# Patient Record
Sex: Male | Born: 1950 | Race: White | Hispanic: No | Marital: Married | Smoking: Former smoker
Health system: Southern US, Community
[De-identification: ages and names within clinical notes are randomized; demographics above are authoritative.]

## PROBLEM LIST (undated history)

## (undated) DIAGNOSIS — I10 Essential (primary) hypertension: Secondary | ICD-10-CM

## (undated) DIAGNOSIS — K219 Gastro-esophageal reflux disease without esophagitis: Secondary | ICD-10-CM

## (undated) DIAGNOSIS — N529 Male erectile dysfunction, unspecified: Secondary | ICD-10-CM

## (undated) DIAGNOSIS — R251 Tremor, unspecified: Secondary | ICD-10-CM

## (undated) HISTORY — PX: ROTATOR CUFF REPAIR: SHX139

## (undated) HISTORY — PX: SECONDARY INTRAOCULAR LENSE IMPLANTATION: SHX2390

## (undated) HISTORY — DX: Male erectile dysfunction, unspecified: N52.9

## (undated) HISTORY — DX: Gastro-esophageal reflux disease without esophagitis: K21.9

## (undated) HISTORY — DX: Tremor, unspecified: R25.1

---

## 2001-12-05 ENCOUNTER — Ambulatory Visit (HOSPITAL_COMMUNITY): Admission: RE | Admit: 2001-12-05 | Discharge: 2001-12-05 | Payer: Self-pay | Admitting: Gastroenterology

## 2006-03-25 ENCOUNTER — Encounter: Admission: RE | Admit: 2006-03-25 | Discharge: 2006-03-25 | Payer: Self-pay | Admitting: Specialist

## 2007-02-06 ENCOUNTER — Ambulatory Visit (HOSPITAL_BASED_OUTPATIENT_CLINIC_OR_DEPARTMENT_OTHER): Admission: RE | Admit: 2007-02-06 | Discharge: 2007-02-06 | Payer: Self-pay | Admitting: Family Medicine

## 2007-02-09 ENCOUNTER — Ambulatory Visit: Payer: Self-pay | Admitting: Internal Medicine

## 2008-11-26 HISTORY — PX: ANKLE FRACTURE SURGERY: SHX122

## 2011-03-05 DIAGNOSIS — Z52001 Unspecified donor, stem cells: Secondary | ICD-10-CM | POA: Insufficient documentation

## 2011-07-09 ENCOUNTER — Other Ambulatory Visit: Payer: Self-pay | Admitting: Gastroenterology

## 2011-11-07 DIAGNOSIS — R251 Tremor, unspecified: Secondary | ICD-10-CM | POA: Insufficient documentation

## 2011-11-22 DIAGNOSIS — E291 Testicular hypofunction: Secondary | ICD-10-CM | POA: Insufficient documentation

## 2012-05-26 DIAGNOSIS — I10 Essential (primary) hypertension: Secondary | ICD-10-CM | POA: Insufficient documentation

## 2013-06-25 ENCOUNTER — Telehealth: Payer: Self-pay | Admitting: Neurology

## 2013-06-30 NOTE — Telephone Encounter (Signed)
Message printed and given for reassignment and appointment, sent to scheduler.

## 2013-07-02 ENCOUNTER — Telehealth: Payer: Self-pay | Admitting: Diagnostic Neuroimaging

## 2013-08-07 ENCOUNTER — Ambulatory Visit: Payer: Self-pay | Admitting: Diagnostic Neuroimaging

## 2013-08-13 ENCOUNTER — Ambulatory Visit: Payer: Self-pay | Admitting: Diagnostic Neuroimaging

## 2014-02-26 NOTE — Telephone Encounter (Signed)
Closing encounter

## 2014-06-02 ENCOUNTER — Encounter (HOSPITAL_COMMUNITY): Payer: Self-pay | Admitting: Emergency Medicine

## 2014-06-02 ENCOUNTER — Emergency Department (HOSPITAL_COMMUNITY)
Admission: EM | Admit: 2014-06-02 | Discharge: 2014-06-02 | Disposition: A | Discharge status: Home / Self Care | Payer: BC Managed Care – PPO | Attending: Emergency Medicine | Admitting: Emergency Medicine

## 2014-06-02 DIAGNOSIS — S0510XA Contusion of eyeball and orbital tissues, unspecified eye, initial encounter: Secondary | ICD-10-CM | POA: Insufficient documentation

## 2014-06-02 DIAGNOSIS — S0591XA Unspecified injury of right eye and orbit, initial encounter: Secondary | ICD-10-CM

## 2014-06-02 DIAGNOSIS — IMO0002 Reserved for concepts with insufficient information to code with codable children: Secondary | ICD-10-CM | POA: Insufficient documentation

## 2014-06-02 DIAGNOSIS — Y9289 Other specified places as the place of occurrence of the external cause: Secondary | ICD-10-CM | POA: Insufficient documentation

## 2014-06-02 DIAGNOSIS — H1130 Conjunctival hemorrhage, unspecified eye: Secondary | ICD-10-CM | POA: Insufficient documentation

## 2014-06-02 DIAGNOSIS — Y9389 Activity, other specified: Secondary | ICD-10-CM | POA: Insufficient documentation

## 2014-06-02 DIAGNOSIS — I10 Essential (primary) hypertension: Secondary | ICD-10-CM | POA: Insufficient documentation

## 2014-06-02 DIAGNOSIS — H20041 Secondary noninfectious iridocyclitis, right eye: Secondary | ICD-10-CM

## 2014-06-02 DIAGNOSIS — H209 Unspecified iridocyclitis: Secondary | ICD-10-CM | POA: Insufficient documentation

## 2014-06-02 HISTORY — DX: Essential (primary) hypertension: I10

## 2014-06-02 MED ORDER — ATROPINE SULFATE 1 % OP SOLN
1.0000 [drp] | Freq: Once | OPHTHALMIC | Status: AC
Start: 2014-06-02 — End: 2014-06-02
  Administered 2014-06-02: 1 [drp] via OPHTHALMIC
  Filled 2014-06-02: qty 2

## 2014-06-02 MED ORDER — TETRACAINE HCL 0.5 % OP SOLN
1.0000 [drp] | Freq: Once | OPHTHALMIC | Status: AC
  Administered 2014-06-02: 1 [drp] via OPHTHALMIC
  Filled 2014-06-02: qty 2

## 2014-06-02 MED ORDER — PREDNISOLONE ACETATE 1 % OP SUSP: 1.0000 [drp] | Freq: Four times a day (QID) | OPHTHALMIC | Status: DC

## 2014-06-02 MED ORDER — ATROPINE SULFATE 1 % OP SOLN: 1.0000 [drp] | Freq: Two times a day (BID) | OPHTHALMIC | Status: DC

## 2014-06-02 MED ORDER — PREDNISOLONE ACETATE 1 % OP SUSP
1.0000 [drp] | Freq: Once | OPHTHALMIC | Status: AC
  Administered 2014-06-02: 1 [drp] via OPHTHALMIC
  Filled 2014-06-02: qty 1

## 2014-06-02 MED ORDER — FLUORESCEIN SODIUM 1 MG OP STRP
1.0000 | ORAL_STRIP | Freq: Once | OPHTHALMIC | Status: AC
  Administered 2014-06-02: 1 via OPHTHALMIC
  Filled 2014-06-02: qty 1
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
## 2014-06-02 NOTE — Discharge Instructions (Signed)
Eye Contusion °An eye contusion is a deep bruise of the eye. This is often called a "black eye." Contusions are the result of an injury that caused bleeding under the skin. The contusion may turn blue, purple, or yellow. Minor injuries will give you a painless contusion, but more severe contusions may stay painful and swollen for a few weeks. If the eye contusion only involves the eyelids and tissues around the eye, the injured area will get better within a few days to weeks. However, eye contusions can be serious and affect the eyeball and sight. °CAUSES  °· Blunt injury or trauma to the face or eye area. °· A forehead injury that causes the blood under the skin to work its way down to the eyelids. °· Rubbing the eyes due to irritation. °SYMPTOMS  °· Swelling and redness around the eye. °· Bruising around the eye. °· Tenderness, soreness, or pain around the eye. °· Blurry vision. °· Tearing. °· Eyeball redness. °DIAGNOSIS  °A diagnosis is usually based on a thorough exam of the eye and surrounding area. The eye must be looked at carefully to make sure it is not injured and to make sure nothing else will threaten your vision. A vision test may be done. An X-ray or computed tomography (CT) scan may be needed to determine if there are any associated injuries, such as broken bones (fractures). °TREATMENT  °If there is an injury to the eye, treatment will be determined by the nature of the injury. °HOME CARE INSTRUCTIONS  °· Put ice on the injured area. °¨ Put ice in a plastic bag. °¨ Place a towel between your skin and the bag. °¨ Leave the ice on for 15-20 minutes, 03-04 times a day. °· If it is determined that there is no injury to the eye, you may continue normal activities. °· Sunglasses may be worn to protect your eyes from bright light if light is uncomfortable. °· Sleep with your head elevated. You can put an extra pillow under your head. This may help with discomfort. °· Only take over-the-counter or  prescription medicines for pain, discomfort, or fever as directed by your caregiver. Do not take aspirin for the first few days. This may increase bruising. °SEEK IMMEDIATE MEDICAL CARE IF:  °· You have any form of vision loss. °· You have double vision. °· You feel nauseous. °· You feel dizzy, sleepy, or like you will faint. °· You have any fluid discharge from the eye or your nose. °· You have swelling and discoloration that does not fade. °MAKE SURE YOU:  °· Understand these instructions. °· Will watch your condition. °· Will get help right away if you are not doing well or get worse. °Document Released: 11/09/2000 Document Revised: 02/04/2012 Document Reviewed: 09/28/2011 °ExitCare® Patient Information ©2015 [employer]. This information is not intended to replace advice given to you by your health care provider. Make sure you discuss any questions you have with your health care provider. ° °

## 2016-07-03 ENCOUNTER — Encounter: Payer: Self-pay | Admitting: Diagnostic Neuroimaging

## 2016-07-03 ENCOUNTER — Ambulatory Visit (INDEPENDENT_AMBULATORY_CARE_PROVIDER_SITE_OTHER): Payer: Medicare Other | Admitting: Diagnostic Neuroimaging

## 2016-07-03 ENCOUNTER — Encounter: Payer: Self-pay | Admitting: *Deleted

## 2016-07-03 VITALS — BP 136/79 | HR 62 | Ht 74.0 in | Wt 224.6 lb

## 2016-07-03 DIAGNOSIS — G250 Essential tremor: Secondary | ICD-10-CM | POA: Diagnosis not present

## 2016-07-03 MED ORDER — PRIMIDONE 250 MG PO TABS: 250.0000 mg | ORAL_TABLET | Freq: Two times a day (BID) | ORAL | 5 refills | Status: DC

## 2016-07-03 NOTE — Progress Notes (Addendum)
[facility]  PATIENT: Steven Rice DOB: [birth year]  REFERRING CLINICIAN:  HISTORY FROM: patient  REASON FOR VISIT: new patient    HISTORICAL  CHIEF COMPLAINT:  Chief Complaint  Patient presents with  . Tremors    rm 6, past pt of Dr Sandria Manly, "tremors getting worse and worse"    HISTORY OF PRESENT ILLNESS:   65 year old right-handed male here for evaluation of essential tremor. Patient reports onset of gradual, progressive postural and action tremor of bilateral upper studies around age 85 years old. No family history of tremor. Patient was diagnosed with essential tremor started on primidone. Patient not taking. On 200 mg daily. Sometimes he takes 2, 3 or 4 total tablets per day is under stressful situations. Patient has noted some side effects including erectile dysfunction, decreased libido, frequent urination which she attributes to primidone. Patient was evaluated by Dr. Sandria Manly and Darrol Angel in the past (from 2002 until last visit 08/18/12).   Patient is frustrated with progressive tremor. Patient tried propranolol at some point in the past but this caused severe side effects. It is affecting his ability to eat, do fine motor tasks, and making him self-conscious in public situations. He is interested in surgical evaluation with deep brain stimulation.  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 Apparently his prior neurologist mention this could've been a factor in his development of tremor. No family history of tremor otherwise.    REVIEW OF SYSTEMS: Full 14 system review of systems performed and negative with exception of: Ringing in ears blurred vision shortness of breath excessive eating difficulty urinating frequent urination back pain muscle Snoring restless legs tremors agitation.  ALLERGIES: Allergies  Allergen Reactions  . Hydrocodone-Acetaminophen Other (See Comments)    Can't sleep    HOME MEDICATIONS: Outpatient  Medications Prior to Visit  Medication Sig Dispense Refill  . atropine 1 % ophthalmic solution Place 1 drop into the right eye 2 (two) times daily. 2 mL 0  . prednisoLONE acetate (PRED FORTE) 1 % ophthalmic suspension Place 1 drop into the right eye 4 (four) times daily. 5 mL 0   No facility-administered medications prior to visit.     PAST MEDICAL HISTORY: Past Medical History:  Diagnosis Date  . ED (erectile dysfunction)    "from primidone"  . Hypertension   . Tremor     PAST SURGICAL HISTORY: Past Surgical History:  Procedure Laterality Date  . ANKLE FRACTURE SURGERY Left 2010  . ROTATOR CUFF REPAIR Right    x 3  . SECONDARY INTRAOCULAR LENSE IMPLANTATION     Rt eye    FAMILY HISTORY: Family History  Problem Relation Age of Onset  . Emphysema Father   . Cancer Brother     SOCIAL HISTORY:  Social History   Social History  . Marital status: Married    Spouse name: N/A  . Number of children: N/A  . Years of education: N/A   Occupational History  . Not on file.   Social History Main Topics  . Smoking status: Former Smoker    Quit date: 07/04/2003  . Smokeless tobacco: Never Used  . Alcohol use No  . Drug use: No  . Sexual activity: Not on file   Other Topics Concern  . Not on file   Social History Narrative  . No narrative on file     PHYSICAL EXAM  GENERAL EXAM/CONSTITUTIONAL: Vitals:  Vitals:   07/03/16 1100  BP: 136/79  Pulse:  62  Weight: 224 lb 9.6 oz (101.9 kg)  Height: 6\' 2"  (1.88 m)     Body mass index is 28.84 kg/m.  Visual Acuity Screening   Right eye Left eye Both eyes  Without correction: 20/50 20/40   With correction:        Patient is in no distress; well developed, nourished and groomed; neck is supple  CARDIOVASCULAR:  Examination of carotid arteries is normal; no carotid bruits  Regular rate and rhythm, no murmurs  Examination of peripheral vascular system by observation and palpation is  normal  EYES:  Ophthalmoscopic exam of optic discs and posterior segments is normal; no papilledema or hemorrhages  MUSCULOSKELETAL:  Gait, strength, tone, movements noted in Neurologic exam below  NEUROLOGIC: MENTAL STATUS:  No flowsheet data found.  awake, alert, oriented to person, place and time  recent and remote memory intact  normal attention and concentration  language fluent, comprehension intact, naming intact,   fund of knowledge appropriate  CRANIAL NERVE:   2nd - no papilledema on fundoscopic exam  2nd, 3rd, 4th, 6th - pupils equal and reactive to light, visual fields full to confrontation, extraocular muscles intact, no nystagmus  5th - facial sensation symmetric  7th - facial strength symmetric  8th - hearing intact  9th - palate elevates symmetrically, uvula midline  11th - shoulder shrug symmetric  12th - tongue protrusion midline  MOTOR:   normal bulk and tone, full strength in the BUE, BLE  MILD POSTURAL AND ACTION TREMOR OF BUE; MILD INTERMITTENT HEAD TREMOR   SENSORY:   normal and symmetric to light touch, temperature, vibration  COORDINATION:   finger-nose-finger, fine finger movements normal  REFLEXES:   deep tendon reflexes present and symmetric  GAIT/STATION:   narrow based gait; able to tandem; romberg is negative    DIAGNOSTIC DATA (LABS, IMAGING, TESTING) - I reviewed patient records, labs, notes, testing and imaging myself where available.  No results found for: WBC, HGB, HCT, MCV, PLT No results found for: NA, K, CL, CO2, GLUCOSE, BUN, CREATININE, CALCIUM, PROT, ALBUMIN, AST, ALT, ALKPHOS, BILITOT, GFRNONAA, GFRAA No results found for: CHOL, HDL, LDLCALC, LDLDIRECT, TRIG, CHOLHDL No results found for: [id number] No results found for: VITAMINB12 No results found for: TSH     ASSESSMENT AND PLAN  65 y.o. year old male here with history of essential tremor since age 65 years old, with suboptimal response to  primidone. Patient tried and failed propranolol in the past. He is interested in deep brain stimulation evaluation.   Dx:  1. Essential tremor      PLAN: - continue primidone 250mg  BID for essential tremor - [facility] (Dr. Angelyn Puntatter) for essential tremor treatment surgical options  Orders Placed This Encounter  Procedures  . Ambulatory referral to Neurosurgery   Meds ordered this encounter  Medications  . primidone (MYSOLINE) 250 MG tablet    Sig: Take 1 tablet (250 mg total) by mouth 2 (two) times daily.    Dispense:  60 tablet    Refill:  5   Return in about 3 months (around 10/03/2016).    Suanne MarkerVIKRAM R. Kourtland Coopman, MD 07/03/2016, 12:03 PM Certified in Neurology, Neurophysiology and Neuroimaging  [facility] [address], [address] [place], [place] [address] [phone]

## 2016-07-03 NOTE — Patient Instructions (Signed)
-   [facility] for [facility] evaluation of essential tremor  - continue primidone 250mg  twice a day

## 2016-07-03 NOTE — Progress Notes (Deleted)
[facility]  PATIENT: Steven Rice Due DOB: 02/06/1951  REFERRING CLINICIAN: *** HISTORY FROM: *** REASON FOR VISIT: ***   HISTORICAL  CHIEF COMPLAINT:  Chief Complaint  Patient presents with  . Tremors    rm 6, past pt of Dr Sandria ManlyLove, "tremors getting worse and worse"    HISTORY OF PRESENT ILLNESS:  ***  REVIEW OF SYSTEMS: Full 14 system review of systems performed and negative with exception of: ***  ALLERGIES: Allergies  Allergen Reactions  . Hydrocodone-Acetaminophen Other (See Comments)    Can't sleep    HOME MEDICATIONS: Outpatient Medications Prior to Visit  Medication Sig Dispense Refill  . atropine 1 % ophthalmic solution Place 1 drop into the right eye 2 (two) times daily. 2 mL 0  . prednisoLONE acetate (PRED FORTE) 1 % ophthalmic suspension Place 1 drop into the right eye 4 (four) times daily. 5 mL 0   No facility-administered medications prior to visit.     PAST MEDICAL HISTORY: Past Medical History:  Diagnosis Date  . ED (erectile dysfunction)    "from primidone"  . Hypertension   . Tremor     PAST SURGICAL HISTORY: Past Surgical History:  Procedure Laterality Date  . ANKLE FRACTURE SURGERY Left 2010  . ROTATOR CUFF REPAIR Right    x 3  . SECONDARY INTRAOCULAR LENSE IMPLANTATION     Rt eye    FAMILY HISTORY: Family History  Problem Relation Age of Onset  . Emphysema Father   . Cancer Brother     SOCIAL HISTORY:  Social History   Social History  . Marital status: Married    Spouse name: N/A  . Number of children: N/A  . Years of education: N/A   Occupational History  . Not on file.   Social History Main Topics  . Smoking status: Former Smoker    Quit date: 07/04/2003  . Smokeless tobacco: Never Used  . Alcohol use No  . Drug use: No  . Sexual activity: Not on file   Other Topics Concern  . Not on file   Social History Narrative  . No narrative on file     PHYSICAL EXAM ***    GENERAL  EXAM/CONSTITUTIONAL: Vitals:  Vitals:   07/03/16 1100  BP: 136/79  Pulse: 62  Weight: 224 lb 9.6 oz (101.9 kg)  Height: 6\' 2"  (1.88 m)     Body mass index is 28.84 kg/m.  Visual Acuity Screening   Right eye Left eye Both eyes  Without correction: 20/50 20/40   With correction:        Patient is in no distress; well developed, nourished and groomed; neck is supple  CARDIOVASCULAR:  Examination of carotid arteries is normal; no carotid bruits  Regular rate and rhythm, no murmurs  Examination of peripheral vascular system by observation and palpation is normal  EYES:  Ophthalmoscopic exam of optic discs and posterior segments is normal; no papilledema or hemorrhages  MUSCULOSKELETAL:  Gait, strength, tone, movements noted in Neurologic exam below  NEUROLOGIC: MENTAL STATUS:  No flowsheet data found.  awake, alert, oriented to person, place and time  recent and remote memory intact  normal attention and concentration  language fluent, comprehension intact, naming intact,   fund of knowledge appropriate  CRANIAL NERVE:   2nd - no papilledema on fundoscopic exam  2nd, 3rd, 4th, 6th - pupils equal and reactive to light, visual fields full to confrontation, extraocular muscles intact, no nystagmus  5th - facial sensation symmetric  7th - facial strength symmetric  8th - hearing intact  9th - palate elevates symmetrically, uvula midline  11th - shoulder shrug symmetric  12th - tongue protrusion midline  MOTOR:   normal bulk and tone, full strength in the BUE, BLE  SENSORY:   normal and symmetric to light touch, pinprick, temperature, vibration  COORDINATION:   finger-nose-finger, fine finger movements normal  REFLEXES:   deep tendon reflexes present and symmetric  GAIT/STATION:   narrow based gait; able to walk on toes, heels and tandem; romberg is negative    DIAGNOSTIC DATA (LABS, IMAGING, TESTING) - I reviewed patient records,  labs, notes, testing and imaging myself where available.  No results found for: WBC, HGB, HCT, MCV, PLT No results found for: NA, K, CL, CO2, GLUCOSE, BUN, CREATININE, CALCIUM, PROT, ALBUMIN, AST, ALT, ALKPHOS, BILITOT, GFRNONAA, GFRAA No results found for: CHOL, HDL, LDLCALC, LDLDIRECT, TRIG, CHOLHDL No results found for: ZOXW9U No results found for: VITAMINB12 No results found for: TSH  ***   ASSESSMENT AND PLAN  65 y.o. year old male here with ***  Localization:  Ddx:  No diagnosis found.   PLAN: ***  No orders of the defined types were placed in this encounter.   No orders of the defined types were placed in this encounter.   No Follow-up on file.    Suanne Marker, MD 07/03/2016, 11:56 AM Certified in Neurology, Neurophysiology and Neuroimaging  [facility] [address] [place], [place] [address] [phone]

## 2016-07-03 NOTE — Progress Notes (Signed)
Today's office visit note faxed to R Vick at patient's request. Release of information faxed with documents.

## 2016-10-08 ENCOUNTER — Ambulatory Visit (INDEPENDENT_AMBULATORY_CARE_PROVIDER_SITE_OTHER): Payer: Medicare Other | Admitting: Diagnostic Neuroimaging

## 2016-10-08 ENCOUNTER — Encounter: Payer: Self-pay | Admitting: Diagnostic Neuroimaging

## 2016-10-08 VITALS — BP 141/77 | HR 64 | Wt 221.4 lb

## 2016-10-08 DIAGNOSIS — G250 Essential tremor: Secondary | ICD-10-CM | POA: Diagnosis not present

## 2016-10-08 MED ORDER — PRIMIDONE 250 MG PO TABS: 250.0000 mg | ORAL_TABLET | Freq: Two times a day (BID) | ORAL | 5 refills | Status: DC

## 2016-10-08 NOTE — Progress Notes (Signed)
[facility]  PATIENT: Steven Rice L Brislin DOB: 05/04/1951  REFERRING CLINICIAN:  HISTORY FROM: patient  REASON FOR VISIT: follow up    HISTORICAL  CHIEF COMPLAINT:  Chief Complaint  Patient presents with  . Tremors    rm 7, "tremors about the same as 3 mos ago"  . Follow-up    3 month    HISTORY OF PRESENT ILLNESS:   UPDATE 10/08/16: Since last visit, had [facility] with Dr. Rubin PayorSiddiqui and Dr. Tempie DonningLaxton. Patient wants to hold off on surgery at this time. Tremor stable.   PRIOR HPI (07/03/16): 65 year old right-handed male here for evaluation of essential tremor. Patient reports onset of gradual, progressive postural and action tremor of bilateral upper studies around age 65 years old. No family history of tremor. Patient was diagnosed with essential tremor started on primidone. Patient not taking. On 200 mg daily. Sometimes he takes 2, 3 or 4 total tablets per day is under stressful situations. Patient has noted some side effects including erectile dysfunction, decreased libido, frequent urination which she attributes to primidone. Patient was evaluated by Dr. Sandria ManlyLove and Darrol Angelarolyn Martin in the past (from 2002 until last visit 08/18/12).  Patient is frustrated with progressive tremor. Patient tried propranolol at some point in the past but this caused severe side effects. It is affecting his ability to eat, do fine motor tasks, and making him self-conscious in public situations. He is interested in surgical evaluation with deep brain stimulation. 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 Apparently his prior neurologist mention this could've been a factor in his development of tremor. No family history of tremor otherwise.   REVIEW OF SYSTEMS: Full 14 system review of systems performed and negative with exception of: Ringing in ears blurred vision excessive eating difficulty urinating frequent urination back pain muscle snoring restless legs  tremors agitation.  ALLERGIES: Allergies  Allergen Reactions  . Hydrocodone-Acetaminophen Other (See Comments)    Can't sleep    HOME MEDICATIONS: Outpatient Medications Prior to Visit  Medication Sig Dispense Refill  . amLODipine (NORVASC) 10 MG tablet Take 10 mg by mouth.    Marland Kitchen. aspirin EC 81 MG tablet Take by mouth.    Marland Kitchen. lisinopril (PRINIVIL,ZESTRIL) 20 MG tablet TAKE 1 TABLET BY MOUTH EVERY DAY    . primidone (MYSOLINE) 250 MG tablet Take 1 tablet (250 mg total) by mouth 2 (two) times daily. 60 tablet 5  . vitamin C (ASCORBIC ACID) 500 MG tablet Take 500 mg by mouth.    . fluticasone (FLONASE) 50 MCG/ACT nasal spray Place into the nose. As needed only     No facility-administered medications prior to visit.     PAST MEDICAL HISTORY: Past Medical History:  Diagnosis Date  . ED (erectile dysfunction)    "from primidone"  . Hypertension   . Tremor     PAST SURGICAL HISTORY: Past Surgical History:  Procedure Laterality Date  . ANKLE FRACTURE SURGERY Left 2010  . ROTATOR CUFF REPAIR Right    x 3  . SECONDARY INTRAOCULAR LENSE IMPLANTATION     Rt eye    FAMILY HISTORY: Family History  Problem Relation Age of Onset  . Emphysema Father   . Cancer Brother     SOCIAL HISTORY:  Social History   Social History  . Marital status: Married    Spouse name: N/A  . Number of children: N/A  . Years of education: N/A   Occupational History  . Not on  file.   Social History Main Topics  . Smoking status: Former Smoker    Quit date: 07/04/2003  . Smokeless tobacco: Never Used  . Alcohol use No  . Drug use: No  . Sexual activity: Not on file   Other Topics Concern  . Not on file   Social History Narrative  . No narrative on file     PHYSICAL EXAM  GENERAL EXAM/CONSTITUTIONAL: Vitals:  Vitals:   10/08/16 1015  BP: (!) 141/77  Pulse: 64  Weight: 221 lb 6.4 oz (100.4 kg)   Body mass index is 28.43 kg/m. No exam data present  Patient is in no distress;  well developed, nourished and groomed; neck is supple  CARDIOVASCULAR:  Examination of carotid arteries is normal; no carotid bruits  Regular rate and rhythm, no murmurs  Examination of peripheral vascular system by observation and palpation is normal  EYES:  Ophthalmoscopic exam of optic discs and posterior segments is normal; no papilledema or hemorrhages  MUSCULOSKELETAL:  Gait, strength, tone, movements noted in Neurologic exam below  NEUROLOGIC: MENTAL STATUS:  No flowsheet data found.  awake, alert, oriented to person, place and time  recent and remote memory intact  normal attention and concentration  language fluent, comprehension intact, naming intact,   fund of knowledge appropriate  CRANIAL NERVE:   2nd - no papilledema on fundoscopic exam  2nd, 3rd, 4th, 6th - pupils equal and reactive to light, visual fields full to confrontation, extraocular muscles intact, no nystagmus  5th - facial sensation symmetric  7th - facial strength symmetric  8th - hearing intact  9th - palate elevates symmetrically, uvula midline  11th - shoulder shrug symmetric  12th - tongue protrusion midline  MOTOR:   normal bulk and tone, full strength in the BUE, BLE  MILD POSTURAL AND ACTION TREMOR OF BUE   SENSORY:   normal and symmetric to light touch, temperature, vibration  COORDINATION:   finger-nose-finger, fine finger movements normal  REFLEXES:   deep tendon reflexes present and symmetric  GAIT/STATION:   narrow based gait; able to tandem; romberg is negative    DIAGNOSTIC DATA (LABS, IMAGING, TESTING) - I reviewed patient records, labs, notes, testing and imaging myself where available.  No results found for: WBC, HGB, HCT, MCV, PLT No results found for: NA, K, CL, CO2, GLUCOSE, BUN, CREATININE, CALCIUM, PROT, ALBUMIN, AST, ALT, ALKPHOS, BILITOT, GFRNONAA, GFRAA No results found for: CHOL, HDL, LDLCALC, LDLDIRECT, TRIG, CHOLHDL No results found  for: HGBA1C No results found for: VITAMINB12 No results found for: TSH     ASSESSMENT AND PLAN  65 y.o. year old male here with history of essential tremor since age 48 years old, with suboptimal response to primidone. Patient tried and failed propranolol in the past.    Dx:  1. Essential tremor      PLAN: I spent 25 minutes of face to face time with patient. Greater than 50% of time was spent in counseling and coordination of care with patient. In summary we discussed:  - diagnosis, prognosis and treatment options - continue primidone 250mg  BID for essential tremor - continue follow up with [facility] neurosurgery for essential tremor treatment surgical options  Meds ordered this encounter  Medications  . primidone (MYSOLINE) 250 MG tablet    Sig: Take 1 tablet (250 mg total) by mouth 2 (two) times daily.    Dispense:  60 tablet    Refill:  5   Return in about 1 year (around 10/08/2017).  Suanne MarkerVIKRAM R. Athel Merriweather, MD 10/08/2016, 10:27 AM Certified in Neurology, Neurophysiology and Neuroimaging  [facility] [address], [address] [place], [place] [address] [phone]

## 2017-01-03 ENCOUNTER — Emergency Department (HOSPITAL_COMMUNITY): Payer: Medicare Other

## 2017-01-03 ENCOUNTER — Emergency Department (HOSPITAL_COMMUNITY)
Admission: EM | Admit: 2017-01-03 | Discharge: 2017-01-03 | Disposition: A | Discharge status: Home / Self Care | Payer: Medicare Other | Attending: Emergency Medicine | Admitting: Emergency Medicine

## 2017-01-03 ENCOUNTER — Encounter (HOSPITAL_COMMUNITY): Payer: Self-pay | Admitting: Emergency Medicine

## 2017-01-03 DIAGNOSIS — R935 Abnormal findings on diagnostic imaging of other abdominal regions, including retroperitoneum: Secondary | ICD-10-CM

## 2017-01-03 DIAGNOSIS — K8020 Calculus of gallbladder without cholecystitis without obstruction: Secondary | ICD-10-CM

## 2017-01-03 DIAGNOSIS — R1013 Epigastric pain: Secondary | ICD-10-CM | POA: Diagnosis present

## 2017-01-03 DIAGNOSIS — I714 Abdominal aortic aneurysm, without rupture, unspecified: Secondary | ICD-10-CM

## 2017-01-03 DIAGNOSIS — I10 Essential (primary) hypertension: Secondary | ICD-10-CM | POA: Insufficient documentation

## 2017-01-03 DIAGNOSIS — Z87891 Personal history of nicotine dependence: Secondary | ICD-10-CM | POA: Diagnosis not present

## 2017-01-03 DIAGNOSIS — K219 Gastro-esophageal reflux disease without esophagitis: Secondary | ICD-10-CM

## 2017-01-03 DIAGNOSIS — Z7982 Long term (current) use of aspirin: Secondary | ICD-10-CM | POA: Diagnosis not present

## 2017-01-03 DIAGNOSIS — Z79899 Other long term (current) drug therapy: Secondary | ICD-10-CM | POA: Diagnosis not present

## 2017-01-03 LAB — CBC WITH DIFFERENTIAL/PLATELET
BASOS PCT: 0 %
Basophils Absolute: 0 10*3/uL (ref 0.0–0.1)
EOS PCT: 1 %
Eosinophils Absolute: 0.1 10*3/uL (ref 0.0–0.7)
HEMATOCRIT: 40.5 % (ref 39.0–52.0)
Hemoglobin: 13.6 g/dL (ref 13.0–17.0)
Lymphocytes Relative: 14 %
Lymphs Abs: 1.3 10*3/uL (ref 0.7–4.0)
MCH: 30 pg (ref 26.0–34.0)
MCHC: 33.6 g/dL (ref 30.0–36.0)
MCV: 89.2 fL (ref 78.0–100.0)
MONO ABS: 0.9 10*3/uL (ref 0.1–1.0)
MONOS PCT: 10 %
NEUTROS ABS: 7.1 10*3/uL (ref 1.7–7.7)
Neutrophils Relative %: 75 %
PLATELETS: 273 10*3/uL (ref 150–400)
RBC: 4.54 MIL/uL (ref 4.22–5.81)
RDW: 13.3 % (ref 11.5–15.5)
WBC: 9.4 10*3/uL (ref 4.0–10.5)

## 2017-01-03 LAB — COMPREHENSIVE METABOLIC PANEL
ALBUMIN: 4.2 g/dL (ref 3.5–5.0)
ALK PHOS: 47 U/L (ref 38–126)
ALT: 38 U/L (ref 17–63)
AST: 30 U/L (ref 15–41)
Anion gap: 9 (ref 5–15)
BILIRUBIN TOTAL: 0.4 mg/dL (ref 0.3–1.2)
BUN: 11 mg/dL (ref 6–20)
CO2: 23 mmol/L (ref 22–32)
CREATININE: 0.87 mg/dL (ref 0.61–1.24)
Calcium: 8 mg/dL — ABNORMAL LOW (ref 8.9–10.3)
Chloride: 104 mmol/L (ref 101–111)
GFR calc Af Amer: >60 mL/min (ref >60)
GLUCOSE: 106 mg/dL — AB (ref 65–99)
POTASSIUM: 5 mmol/L (ref 3.5–5.1)
Sodium: 136 mmol/L (ref 135–145)
TOTAL PROTEIN: 7.5 g/dL (ref 6.5–8.1)

## 2017-01-03 LAB — I-STAT TROPONIN, ED: Troponin i, poc: 0 ng/mL (ref 0.00–0.08)

## 2017-01-03 LAB — I-STAT CHEM 8, ED
BUN: 10 mg/dL (ref 6–20)
CALCIUM ION: 1.04 mmol/L — AB (ref 1.15–1.40)
CHLORIDE: 103 mmol/L (ref 101–111)
Creatinine, Ser: 0.8 mg/dL (ref 0.61–1.24)
GLUCOSE: 106 mg/dL — AB (ref 65–99)
HCT: 42 % (ref 39.0–52.0)
Hemoglobin: 14.3 g/dL (ref 13.0–17.0)
Potassium: 4.8 mmol/L (ref 3.5–5.1)
Sodium: 139 mmol/L (ref 135–145)
TCO2: 25 mmol/L (ref 0–100)

## 2017-01-03 LAB — LIPASE, BLOOD: Lipase: 35 U/L (ref 11–51)

## 2017-01-03 MED ORDER — FAMOTIDINE IN NACL 20-0.9 MG/50ML-% IV SOLN
20.0000 mg | Freq: Once | INTRAVENOUS | Status: AC
Start: 2017-01-03 — End: 2017-01-03
  Administered 2017-01-03: 20 mg via INTRAVENOUS
  Filled 2017-01-03: qty 50

## 2017-01-03 MED ORDER — FAMOTIDINE 20 MG PO TABS: 20.0000 mg | ORAL_TABLET | Freq: Two times a day (BID) | ORAL | 0 refills | Status: AC

## 2017-01-03 MED ORDER — ONDANSETRON HCL 4 MG/2ML IJ SOLN
4.0000 mg | Freq: Once | INTRAMUSCULAR | Status: DC
  Filled 2017-01-03: qty 2

## 2017-01-03 MED ORDER — IOPAMIDOL (ISOVUE-370) INJECTION 76%
INTRAVENOUS | Status: AC
  Filled 2017-01-03: qty 100

## 2017-01-03 MED ORDER — HYDROMORPHONE HCL 1 MG/ML IJ SOLN
0.5000 mg | Freq: Once | INTRAMUSCULAR | Status: DC
  Filled 2017-01-03: qty 1

## 2017-01-03 MED ORDER — IOPAMIDOL (ISOVUE-370) INJECTION 76%
100.0000 mL | Freq: Once | INTRAVENOUS | Status: AC | PRN
  Administered 2017-01-03: 100 mL via INTRAVENOUS

## 2017-01-03 MED ORDER — SODIUM CHLORIDE 0.9 % IJ SOLN
INTRAMUSCULAR | Status: AC
  Filled 2017-01-03: qty 50

## 2017-01-03 NOTE — ED Notes (Signed)
zofran returned to machine

## 2017-01-03 NOTE — ED Provider Notes (Signed)
Pt seen and evaluated.  D/W N. [place] pA-C. +gallstones, but normal Hepatobiliary enzymes, o fever or leukocytosis to suggest acute cholecystitis.  Pt made aware of imaging studies.  Agree with Dc on H2 blocker for dyspepsia.  Return precautions discussed.   Rolland PorterMark Conda Wannamaker, MD 01/03/17 1032

## 2017-01-03 NOTE — ED Triage Notes (Signed)
Pt c/o epigastric pain since 2330 last night; describes pain as shooting in nature; states pain has not subsided since it began; pt is tachypnic in triage and appears distressed; SpO2 100%; endorses nausea and back pain, denies vomiting and diarrhea; denies smoking, HTN, and diabetes

## 2017-01-03 NOTE — ED Notes (Signed)
Patient transported to CT 

## 2017-01-03 NOTE — Discharge Instructions (Signed)
Imaging today shows that your aorta in your abdomen slightly dilated, it is very important that you let your primary care physician know that this is happening, they will need to repeat imaging to check that it isn't worsening.  There is a lesion on your hip bone. You may need prostate blood level testing to further evaluate this.  Please follow with your primary care doctor in the next 2 days for a check-up. They must obtain records for further management.   Do not hesitate to return to the Emergency Department for any new, worsening or concerning symptoms.

## 2017-01-03 NOTE — ED Provider Notes (Signed)
[facility]-EMERGENCY DEPT Provider Note   CSN: [id number] Arrival date & time: 01/03/17  4098     History   Chief Complaint Chief Complaint  Patient presents with  . Abdominal Pain    HPI     Blood pressure 151/77, pulse 69, temperature 97.6 F (36.4 C), temperature source Oral, resp. rate (!) 30, SpO2 100 %.  Steven Rice is a 66 y.o. male complaining of severe, acute, shooting, epigastric pain onset at 11 PM last night, states it's exacerbated by certain positions and lying supine in palpation. States it radiates to the back right on the xiphoid process.Marland Kitchen He denies any nausea or vomiting (states he tried to induce vomiting he's passing flatus normally, he tried to have a bowel movement to see if that would help but he couldn't. He is urinating normally. He denies fever, chills, chest pain, shortness of breath. He states that the pain was severe, 10 out of 10 he prayed with his mother and it improved to about 7 out of 10. She has history of hypertension, former smoker in the remote past.   Past Medical History:  Diagnosis Date  . ED (erectile dysfunction)    "from primidone"  . Hypertension   . Tremor     Patient Active Problem List   Diagnosis Date Noted  . Essential tremor 07/03/2016    Past Surgical History:  Procedure Laterality Date  . ANKLE FRACTURE SURGERY Left 2010  . ROTATOR CUFF REPAIR Right    x 3  . SECONDARY INTRAOCULAR LENSE IMPLANTATION     Rt eye       Home Medications    Prior to Admission medications   Medication Sig Start Date End Date Taking? Authorizing Provider  amLODipine (NORVASC) 10 MG tablet Take 10 mg by mouth daily.    Yes Historical Provider, MD  aspirin EC 81 MG tablet Take 81 mg by mouth daily.    Yes Historical Provider, MD  lisinopril (PRINIVIL,ZESTRIL) 20 MG tablet Take 20 mg by mouth daily.   Yes Historical Provider, MD  primidone (MYSOLINE) 250 MG tablet Take 1 tablet (250 mg total) by mouth 2 (two) times daily. 10/08/16  Yes  Suanne Marker, MD  vitamin C (ASCORBIC ACID) 500 MG tablet Take 500 mg by mouth daily.    Yes Historical Provider, MD  famotidine (PEPCID) 20 MG tablet Take 1 tablet (20 mg total) by mouth 2 (two) times daily. 01/03/17   Joni Reining Lucilia Yanni, PA-C    Family History Family History  Problem Relation Age of Onset  . Emphysema Father   . Cancer Brother     Social History Social History  Substance Use Topics  . Smoking status: Former Smoker    Quit date: 07/04/2003  . Smokeless tobacco: Never Used  . Alcohol use No     Allergies   Hydrocodone-acetaminophen   Review of Systems Review of Systems  10 systems reviewed and found to be negative, except as noted in the HPI.   Physical Exam Updated Vital Signs BP 140/75   Pulse (!) 58   Temp 97.6 F (36.4 C) (Oral)   Resp 14   Ht 6' (1.829 m)   Wt 102.1 kg   SpO2 96%   BMI 30.52 kg/m   Physical Exam  Constitutional: He is oriented to person, place, and time. He appears well-developed and well-nourished. No distress.  HENT:  Head: Normocephalic and atraumatic.  Mouth/Throat: Oropharynx is clear and moist.  Eyes: Conjunctivae and EOM are normal. Pupils  are equal, round, and reactive to light.  Neck: Normal range of motion.  Cardiovascular: Normal rate, regular rhythm and intact distal pulses.   Pulses equal x4  Pulmonary/Chest: Effort normal and breath sounds normal. No respiratory distress. He has no wheezes. He has no rales. He exhibits no tenderness.  Abdominal: Soft. He exhibits no distension and no mass. There is no tenderness. There is no rebound and no guarding. No hernia.  Normal active bowel sounds, no tenderness to deep palpation of any quadrant.  Musculoskeletal: Normal range of motion.  Neurological: He is alert and oriented to person, place, and time.  Skin: He is not diaphoretic.  Psychiatric: He has a normal mood and affect.  Nursing note and vitals reviewed.   ED Treatments / Results  Labs (all labs  ordered are listed, but only abnormal results are displayed) Labs Reviewed  COMPREHENSIVE METABOLIC PANEL - Abnormal; Notable for the following:       Result Value   Glucose, Bld 106 (*)    Calcium 8.0 (*)    All other components within normal limits  I-STAT CHEM 8, ED - Abnormal; Notable for the following:    Glucose, Bld 106 (*)    Calcium, Ion 1.04 (*)    All other components within normal limits  LIPASE, BLOOD  CBC WITH DIFFERENTIAL/PLATELET  Rosezena Sensor, ED  I-STAT TROPOININ, ED    EKG  EKG Interpretation  Date/Time:  Thursday January 03 2017 05:36:33 EST Ventricular Rate:  66 PR Interval:    QRS Duration: 90 QT Interval:  407 QTC Calculation: 427 R Axis:   -41 Text Interpretation:  Sinus rhythm Left anterior fascicular block Abnormal R-wave progression, early transition Confirmed by DELO  MD, Riley Lam ([id number]) on 01/03/2017 6:13:12 AM Also confirmed by Judd Lien  MD, DOUGLAS ([id number]), editor [place]  CCT, BEVERLY ([id number])  on 01/03/2017 8:02:54 AM       Radiology US Aorta Port  Result Date: 01/03/2017 CLINICAL DATA:  Acute epigastric pain EXAM: ULTRASOUND OF ABDOMINAL AORTA TECHNIQUE: Ultrasound examination of the abdominal aorta was performed to evaluate for abdominal aortic aneurysm. COMPARISON:  None. FINDINGS: Abdominal aortic diameter measures 3.2 cm proximally, 2.8 cm midpoint, and 2.3 cm distally. The proximal common iliac arteries have a normal diameter of up to 1.3 cm. Atheromatous wall echogenicity noted. No luminal flap noted. Color Doppler not applied. IMPRESSION: Dilated upper abdominal aorta measuring 3.2 cm. Recommend followup by ultrasound in 3 years. This recommendation follows ACR consensus guidelines: White Paper of the ACR Incidental Findings Committee II on Vascular Findings. [place] Coll Radiol 2013; 10:789-794 Electronically Signed   By: Marnee Spring M.D.   On: 01/03/2017 07:59   Dg Chest Port 1 View  Result Date: 01/03/2017 CLINICAL DATA:  Chest pain for  several hours EXAM: PORTABLE CHEST 1 VIEW COMPARISON:  None. FINDINGS: Cardiac shadow is mildly enlarged but accentuated by the portable technique. The lungs are well aerated bilaterally. No focal infiltrate or sizable effusion is seen. No acute bony abnormality is noted. IMPRESSION: No acute abnormality noted. Electronically Signed   By: Alcide Clever M.D.   On: 01/03/2017 07:36   Ct Angio Chest/abd/pel For Dissection W And/or Wo Contrast  Result Date: 01/03/2017 CLINICAL DATA:  Chest and abdominal pain.  Nausea. EXAM: CT ANGIOGRAPHY CHEST, ABDOMEN AND PELVIS TECHNIQUE: Initially, axial CT images were obtained through the chest without intravenous contrast material administration. Multidetector CT imaging through the chest, abdomen and pelvis was performed using the standard protocol during bolus administration  of intravenous contrast. Multiplanar reconstructed images and MIPs were obtained and reviewed to evaluate the vascular anatomy. CONTRAST:  100 mL Isovue 370 nonionic COMPARISON:  Chest radiograph January 03, 2017 ; ultrasound abdominal aorta January 03, 2017 FINDINGS: CTA CHEST FINDINGS Cardiovascular: No thoracic aortic intramural hematoma is evident on the noncontrast enhanced study. There is no demonstrable thoracic aortic aneurysm or dissection. The visualized great vessels are patent. There is modest atherosclerotic calcification at the origin of the left subclavian artery. There are scattered foci of calcification in the aortic arch region. There is calcification in the left anterior descending coronary artery. The pericardium is not appreciably thickened. There is no demonstrable pulmonary embolus. Mediastinum/Nodes: Thyroid appears unremarkable. There is no appreciable thoracic adenopathy. Lungs/Pleura: There is mild bibasilar atelectasis. There is localized opacity along the periphery of the lateral segment of the right lower lobe which probably represents atelectasis but potentially may represent  earliest changes of pneumonia. There is no appreciable pleural effusion or pleural thickening. Musculoskeletal: There are no blastic or lytic bone lesions. There is no evident chest wall lesion. Review of the MIP images confirms the above findings. CTA ABDOMEN AND PELVIS FINDINGS VASCULAR Aorta: There is localized dilatation in the mid abdominal aortic region with a maximum transverse diameter of 3.4 x 3.1 cm. There is atherosclerotic calcification in the mid to distal aorta. There is no abdominal aortic dissection. Celiac: The celiac artery and its major branches are widely patent. No aneurysm or dissection. SMA: The superior mesenteric artery and its major branches are widely patent. No aneurysm or dissection. Renals: There are single renal arteries arising from the abdominal aorta. There are fairly proximal branches off the proximal main renal arteries bilaterally. The renal arteries and major branches bilaterally widely patent. No fibromuscular dysplasia. No aneurysm or dissection. IMA: The inferior mesenteric artery at its major branches appear patent. No demonstrable aneurysm or dissection. Inflow: There is atherosclerotic calcification in each common iliac artery with mild calcification in the proximal internal iliac arteries bilaterally. No hemodynamically significant obstruction seen in either vessel. Scattered foci of calcification are noted in each proximal common femoral artery. No hemodynamically significant obstruction on either side. No aneurysm or dissection. Veins: No obvious venous abnormality within the limitations of this arterial phase study. Review of the MIP images confirms the above findings. NON-VASCULAR Hepatobiliary: There is hepatic steatosis. There is probable fatty sparing near the gallbladder fossa. No focal liver lesions evident. There are gallstones within the gallbladder. There appears to be pericholecystic fluid in the gallbladder region with borderline gallbladder wall thickening.  There is no appreciable biliary duct dilatation. Pancreas: No pancreatic mass or inflammatory focus. Spleen: No splenic lesions are evident. Adrenals/Urinary Tract: Adrenals appear unremarkable bilaterally. There is a cyst arising from the periphery of the left kidney laterally measuring 2.2 x 2.2 cm. There is no hydronephrosis on either side. There is no renal or ureteral calculus on either side. Urinary bladder is midline with wall thickness within normal limits. Stomach/Bowel: There are multiple sigmoid diverticula without diverticulitis. No bowel wall or mesenteric thickening. No bowel obstruction. No free air or portal venous air. Lymphatic: No adenopathy evident in the abdomen or pelvis. Reproductive: There are small calcifications in the prostate. Prostate and seminal vesicles appear normal in size and contour. There is inhomogeneous contrast enhancement within the prostate. Other: Appendix appears normal. No abscess or ascites evident in the abdomen or pelvis. Musculoskeletal: No lytic or destructive bone lesions. There is a probable bone island in the inferior right  iliac crest measuring 1 x 1 cm. No other sclerotic appearing bone lesions. No intramuscular or abdominal wall lesion. Review of the MIP images confirms the above findings. IMPRESSION: CT angiogram chest: No thoracic aortic aneurysm or dissection. No demonstrable pulmonary embolus. There is atherosclerotic calcification in the aorta as well as calcification in the left anterior descending coronary artery. Atelectatic change in the lateral right base with questionable earliest changes of pneumonia in the periphery of the lateral segment right lower lobe. Mild subsegmental atelectasis noted elsewhere in each lung base. No adenopathy. CT angiogram abdomen and pelvis: No dissections are noted in the aorta well or mesenteric/pelvic arterial vessels. There is mild aneurysmal dilatation in the mid the distal abdominal aorta with a maximum transverse  diameter of 3.4 x 3.1 cm. Recommend followup by ultrasound in 3 years. This recommendation follows ACR consensus guidelines: White Paper of the ACR Incidental Findings Committee II on Vascular Findings. J Am Coll Radiol 2013; 10:789-794. No periaortic fluid. Scattered foci of atherosclerotic calcification without hemodynamically significant obstruction. Gallstones with apparent pericholecystic fluid. Suspect a degree of acute cholecystitis. Correlation with ultrasound of the gallbladder region may be advisable. Hepatic steatosis with probable fatty sparing near the gallbladder fossa. Extensive sigmoid diverticulosis without frank diverticulitis. Inhomogeneous enhancement of the prostate. Question a degree of prostatitis. Advise direct physical examination as well as PSA correlation. Probable bone island in the right inferior iliac crest. Sclerotic bone lesion, correlation with PSA again advised. No renal or ureteral calculi. No hydronephrosis. No abscess. Appendix appears normal. No bowel obstruction. Electronically Signed   By: Bretta Bang III M.D.   On: 01/03/2017 09:10   US Abdomen Limited Ruq  Result Date: 01/03/2017 CLINICAL DATA:  Epigastric pain, gallstones on chest CT scan of earlier today. EXAM: US ABDOMEN LIMITED - RIGHT UPPER QUADRANT COMPARISON:  CT scan of the abdomen and pelvis of today's date FINDINGS: Gallbladder: The gallbladder is adequately distended. There are echogenic mobile shadowing stones. There is no gallbladder wall thickening. There is a small amount of pericholecystic fluid. There is no positive sonographic Murphy's sign. Common bile duct: Diameter: 6.9 mm.  No intraluminal echoes are observed. Liver: The hepatic echotexture is mildly increased. There is no focal mass nor ductal dilation. Probable focal fatty sparing adjacent to the gallbladder. IMPRESSION: Gallstones with trace pericholecystic fluid. No gallbladder wall thickening or positive sonographic Murphy's sign to  suggest acute cholecystitis. Top-normal common bile duct diameter is 6.9 mm. Increased hepatic echotexture compatible with fatty infiltrative change. Electronically Signed   By: David  [place] M.D.   On: 01/03/2017 10:05    Procedures Procedures (including critical care time)  Medications Ordered in ED Medications  HYDROmorphone (DILAUDID) injection 0.5 mg (0.5 mg Intravenous Not Given 01/03/17 0657)  ondansetron (ZOFRAN) injection 4 mg (4 mg Intravenous Not Given 01/03/17 0656)  sodium chloride 0.9 % injection (not administered)  iopamidol (ISOVUE-370) 76 % injection (not administered)  famotidine (PEPCID) IVPB 20 mg premix (20 mg Intravenous New Bag/Given 01/03/17 1006)  iopamidol (ISOVUE-370) 76 % injection 100 mL (100 mLs Intravenous Contrast Given 01/03/17 0829)     Initial Impression / Assessment and Plan / ED Course  I have reviewed the triage vital signs and the nursing notes.  Pertinent labs & imaging results that were available during my care of the patient were reviewed by me and considered in my medical decision making (see chart for details).     Vitals:   01/03/17 0741 01/03/17 0745 01/03/17 1011 01/03/17 1013  BP:  156/82 140/75   Pulse:  (!) 59  (!) 58  Resp:  20  14  Temp:      TempSrc:      SpO2:  97%  96%  Weight: 102.1 kg     Height: 6' (1.829 m)       Medications  HYDROmorphone (DILAUDID) injection 0.5 mg (0.5 mg Intravenous Not Given 01/03/17 0657)  ondansetron (ZOFRAN) injection 4 mg (4 mg Intravenous Not Given 01/03/17 0656)  sodium chloride 0.9 % injection (not administered)  iopamidol (ISOVUE-370) 76 % injection (not administered)  famotidine (PEPCID) IVPB 20 mg premix (20 mg Intravenous New Bag/Given 01/03/17 1006)  iopamidol (ISOVUE-370) 76 % injection 100 mL (100 mLs Intravenous Contrast Given 01/03/17 0829)    Ihor Gullyrnest L Funchess is 66 y.o. male presenting with severe epigastric pain radiating to the back onset last night resolving spontaneously. Consider anginal  equivalent however, EKG troponin negative.UltrasoundWith aortic dilation of 3 cm. Given his radiation to the back, will obtain dissection study.  No dissection noted, the see the aneurysm measures 3.4 cm at the widest point. Recommend heat imaging in 3 years. They also noted sclerotic lesion on the iliac crest and recommend follow-up PSA. Surprisingly, findings consistent with cholecystitis, clinically this is unlikely however will obtain ultrasound for further evaluation.  Ultrasound shows gallstones with trace pericholecystic fluid or bladder wall thickening or positive sonographic Murphy's sign. Patient has normal LFTs. I think this is very unlikely the source of his discomfort, will start treating with Pepcid, I recommend a can with his gastroenterologist Dr. May God. They understand that the multiple abnormalities noted incidentally on CAT scan will need to be followed by primary care.  This is a shared visit with the attending physician who personally evaluated the patient and agrees with the care plan.   Patient remains pain-free the entire time he's been in the emergency department.  Evaluation does not show pathology that would require ongoing emergent intervention or inpatient treatment. Pt is hemodynamically stable and mentating appropriately. Discussed findings and plan with patient/guardian, who agrees with care plan. All questions answered. Return precautions discussed and outpatient follow up given.      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     Wynetta Emeryicole Glayds [place], PA-C 01/03/17 1031    Geoffery Lyonsouglas Delo, MD 01/03/17 [phone]

## 2017-01-04 DIAGNOSIS — K5730 Diverticulosis of large intestine without perforation or abscess without bleeding: Secondary | ICD-10-CM | POA: Insufficient documentation

## 2017-01-04 DIAGNOSIS — I714 Abdominal aortic aneurysm, without rupture, unspecified: Secondary | ICD-10-CM | POA: Insufficient documentation

## 2017-01-04 DIAGNOSIS — K760 Fatty (change of) liver, not elsewhere classified: Secondary | ICD-10-CM | POA: Insufficient documentation

## 2017-01-04 DIAGNOSIS — K8020 Calculus of gallbladder without cholecystitis without obstruction: Secondary | ICD-10-CM | POA: Insufficient documentation

## 2017-01-04 DIAGNOSIS — K219 Gastro-esophageal reflux disease without esophagitis: Secondary | ICD-10-CM | POA: Insufficient documentation

## 2017-02-20 ENCOUNTER — Telehealth: Payer: Self-pay | Admitting: *Deleted

## 2017-02-20 NOTE — Telephone Encounter (Signed)
LVM requesting patient call back to reschedule follow up. Provider on vacation. Advised he may see NP on a day Dr Marjory LiesPenumalli is in office. Advised Dr Richrd HumblesPenumalli's schedule is booking in July. Left number .

## 2017-04-08 ENCOUNTER — Ambulatory Visit: Payer: Medicare Other | Admitting: Diagnostic Neuroimaging

## 2017-04-16 ENCOUNTER — Encounter: Payer: Self-pay | Admitting: Diagnostic Neuroimaging

## 2017-04-16 ENCOUNTER — Ambulatory Visit (INDEPENDENT_AMBULATORY_CARE_PROVIDER_SITE_OTHER): Payer: Medicare Other | Admitting: Diagnostic Neuroimaging

## 2017-04-16 ENCOUNTER — Encounter (INDEPENDENT_AMBULATORY_CARE_PROVIDER_SITE_OTHER): Payer: Self-pay

## 2017-04-16 VITALS — BP 142/85 | HR 61 | Wt 221.0 lb

## 2017-04-16 DIAGNOSIS — G250 Essential tremor: Secondary | ICD-10-CM

## 2017-04-16 MED ORDER — PRIMIDONE 250 MG PO TABS: 250.0000 mg | ORAL_TABLET | Freq: Two times a day (BID) | ORAL | 4 refills | Status: DC

## 2017-04-16 MED ORDER — TOPIRAMATE 50 MG PO TABS: 50.0000 mg | ORAL_TABLET | Freq: Two times a day (BID) | ORAL | 3 refills | Status: DC

## 2017-04-16 NOTE — Progress Notes (Signed)
[facility]  PATIENT: Steven Rice DOB: 08/22/1951  REFERRING CLINICIAN:  HISTORY FROM: patient  REASON FOR VISIT: follow up    HISTORICAL  CHIEF COMPLAINT:  Chief Complaint  Patient presents with  . Essential tremor    rm 7, "no change in tremors"  . Follow-up    6 month    HISTORY OF PRESENT ILLNESS:   UPDATE 04/16/17: Since last visit, doing well. Tremor stable. Tolerating primidone. Has considered DBS, but wants to hold off for now.   UPDATE 10/08/16: Since last visit, had evaluation at [facility] with Dr. Rubin Payor and Dr. Tempie Donning. Patient wants to hold off on surgery at this time. Tremor stable.   PRIOR HPI (07/03/16): 66 year old right-handed male here for evaluation of essential tremor. Patient reports onset of gradual, progressive postural and action tremor of bilateral upper studies around age 14 years old. No family history of tremor. Patient was diagnosed with essential tremor started on primidone. Patient not taking. On 200 mg daily. Sometimes he takes 2, 3 or 4 total tablets per day is under stressful situations. Patient has noted some side effects including erectile dysfunction, decreased libido, frequent urination which she attributes to primidone. Patient was evaluated by Dr. Sandria Manly and Darrol Angel in the past (from 2002 until last visit 08/18/12).  Patient is frustrated with progressive tremor. Patient tried propranolol at some point in the past but this caused severe side effects. It is affecting his ability to eat, do fine motor tasks, and making him self-conscious in public situations. He is interested in surgical evaluation with deep brain stimulation. 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 Apparently his prior neurologist mention this could've been a factor in his development of tremor. No family history of tremor otherwise.   REVIEW OF SYSTEMS: Full 14 system review of systems performed and negative with exception of:  tremors freq urination restless legs.    ALLERGIES: Allergies  Allergen Reactions  . Hydrocodone-Acetaminophen Other (See Comments)    Reaction:  Insomnia     HOME MEDICATIONS: Outpatient Medications Prior to Visit  Medication Sig Dispense Refill  . amLODipine (NORVASC) 10 MG tablet Take 10 mg by mouth daily.     Marland Kitchen aspirin EC 81 MG tablet Take 81 mg by mouth daily.     . famotidine (PEPCID) 20 MG tablet Take 1 tablet (20 mg total) by mouth 2 (two) times daily. 10 tablet 0  . lisinopril (PRINIVIL,ZESTRIL) 20 MG tablet Take 20 mg by mouth daily.    . primidone (MYSOLINE) 250 MG tablet Take 1 tablet (250 mg total) by mouth 2 (two) times daily. 60 tablet 5  . vitamin C (ASCORBIC ACID) 500 MG tablet Take 500 mg by mouth daily.      No facility-administered medications prior to visit.     PAST MEDICAL HISTORY: Past Medical History:  Diagnosis Date  . Acid reflux   . ED (erectile dysfunction)    "from primidone"  . Hypertension   . Tremor     PAST SURGICAL HISTORY: Past Surgical History:  Procedure Laterality Date  . ANKLE FRACTURE SURGERY Left 2010  . ROTATOR CUFF REPAIR Right    x 3  . SECONDARY INTRAOCULAR LENSE IMPLANTATION     Rt eye    FAMILY HISTORY: Family History  Problem Relation Age of Onset  . Emphysema Father   . Cancer Brother     SOCIAL HISTORY:  Social History   Social History  . Marital status:  Married    Spouse name: N/A  . Number of children: N/A  . Years of education: N/A   Occupational History  . Not on file.   Social History Main Topics  . Smoking status: Former Smoker    Quit date: 07/04/2003  . Smokeless tobacco: Never Used  . Alcohol use No  . Drug use: No  . Sexual activity: Not on file   Other Topics Concern  . Not on file   Social History Narrative  . No narrative on file     PHYSICAL EXAM  GENERAL EXAM/CONSTITUTIONAL: Vitals:  Vitals:   04/16/17 1131  BP: (!) 142/85  Pulse: 61  Weight: 221 lb (100.2 kg)    Body mass index is 29.97 kg/m. No exam data present  Patient is in no distress; well developed, nourished and groomed; neck is supple  CARDIOVASCULAR:  Examination of carotid arteries is normal; no carotid bruits  Regular rate and rhythm, no murmurs  Examination of peripheral vascular system by observation and palpation is normal  EYES:  Ophthalmoscopic exam of optic discs and posterior segments is normal; no papilledema or hemorrhages  MUSCULOSKELETAL:  Gait, strength, tone, movements noted in Neurologic exam below  NEUROLOGIC: MENTAL STATUS:  No flowsheet data found.  awake, alert, oriented to person, place and time  recent and remote memory intact  normal attention and concentration  language fluent, comprehension intact, naming intact,   fund of knowledge appropriate  CRANIAL NERVE:   2nd - no papilledema on fundoscopic exam  2nd, 3rd, 4th, 6th - pupils equal and reactive to light, visual fields full to confrontation, extraocular muscles intact, no nystagmus  5th - facial sensation symmetric  7th - facial strength symmetric  8th - hearing intact  9th - palate elevates symmetrically, uvula midline  11th - shoulder shrug symmetric  12th - tongue protrusion midline  MOTOR:   normal bulk and tone, full strength in the BUE, BLE  MILD POSTURAL AND ACTION TREMOR OF BUE   SENSORY:   normal and symmetric to light touch, temperature, vibration  COORDINATION:   finger-nose-finger, fine finger movements normal  REFLEXES:   deep tendon reflexes present and symmetric  GAIT/STATION:   narrow based gait    DIAGNOSTIC DATA (LABS, IMAGING, TESTING) - I reviewed patient records, labs, notes, testing and imaging myself where available.  Lab Results  Component Value Date   WBC 9.4 01/03/2017   HGB 14.3 01/03/2017   HCT 42.0 01/03/2017   MCV 89.2 01/03/2017   PLT 273 01/03/2017      Component Value Date/Time   NA 139 01/03/2017 0702   K  4.8 01/03/2017 0702   CL 103 01/03/2017 0702   CO2 23 01/03/2017 0637   GLUCOSE 106 (H) 01/03/2017 0702   BUN 10 01/03/2017 0702   CREATININE 0.80 01/03/2017 0702   CALCIUM 8.0 (L) 01/03/2017 0637   PROT 7.5 01/03/2017 0637   ALBUMIN 4.2 01/03/2017 0637   AST 30 01/03/2017 0637   ALT 38 01/03/2017 0637   ALKPHOS 47 01/03/2017 0637   BILITOT 0.4 01/03/2017 0637   GFRNONAA >60 01/03/2017 0637   GFRAA >60 01/03/2017 0637   No results found for: CHOL, HDL, LDLCALC, LDLDIRECT, TRIG, CHOLHDL No results found for: ZOXW9U No results found for: VITAMINB12 No results found for: TSH     ASSESSMENT AND PLAN  66 y.o. year old male here with history of essential tremor since age 62 years old, with suboptimal response to primidone. Patient tried and failed  propranolol and gabapentin in the past.     Dx:  1. Essential tremor      PLAN: I spent 15 minutes of face to face time with patient. Greater than 50% of time was spent in counseling and coordination of care with patient. In summary we discussed:   - diagnosis, prognosis and treatment options  - continue primidone 250mg  BID for essential tremor  - trial of topiramate 50mg  daily; after 1-2 weeks increase to twice a day  - continue follow [facility] with [facility] for essential tremor treatment surgical options as needed if patient decides to pursue surgical treatment  Meds ordered this encounter  Medications  . primidone (MYSOLINE) 250 MG tablet    Sig: Take 1 tablet (250 mg total) by mouth 2 (two) times daily.    Dispense:  180 tablet    Refill:  4  . topiramate (TOPAMAX) 50 MG tablet    Sig: Take 1 tablet (50 mg total) by mouth 2 (two) times daily.    Dispense:  60 tablet    Refill:  3   Return in about 6 months (around 10/17/2017).    Suanne MarkerVIKRAM R. Zineb Glade, MD 04/16/2017, 11:56 AM Certified in Neurology, Neurophysiology and Neuroimaging  [facility] [address], [address] [place], [place]  [address] [phone]

## 2017-04-16 NOTE — Patient Instructions (Signed)
-   continue primidone 250mg  twice a day for essential tremor  - add on topiramate 50mg  daily; after 1-2 weeks increase to twice a day; drink plenty of water

## 2017-06-25 ENCOUNTER — Telehealth: Payer: Self-pay | Admitting: *Deleted

## 2017-06-25 NOTE — Telephone Encounter (Signed)
Patient has been rescheduled to see Dr. Marjory LiesPenumalli 10/21/17 @ 3:30pm.

## 2017-06-25 NOTE — Telephone Encounter (Signed)
LVM informing patient that his FU in Nov needs to be rescheduled due to dr being out of the office. Advised him there are openings in Dec. Requested he call back to reschedule, left office number. Phone staff may reschedule when patient calls back.

## 2017-08-20 ENCOUNTER — Other Ambulatory Visit: Payer: Self-pay | Admitting: *Deleted

## 2017-08-20 MED ORDER — TOPIRAMATE 50 MG PO TABS: 50.0000 mg | ORAL_TABLET | Freq: Two times a day (BID) | ORAL | 1 refills | Status: DC

## 2017-10-14 ENCOUNTER — Ambulatory Visit: Payer: Medicare Other | Admitting: Diagnostic Neuroimaging

## 2017-10-21 ENCOUNTER — Encounter (INDEPENDENT_AMBULATORY_CARE_PROVIDER_SITE_OTHER): Payer: Self-pay

## 2017-10-21 ENCOUNTER — Encounter: Payer: Self-pay | Admitting: Diagnostic Neuroimaging

## 2017-10-21 ENCOUNTER — Ambulatory Visit (INDEPENDENT_AMBULATORY_CARE_PROVIDER_SITE_OTHER): Payer: Medicare Other | Admitting: Diagnostic Neuroimaging

## 2017-10-21 VITALS — BP 131/73 | HR 55 | Ht 75.0 in | Wt 225.2 lb

## 2017-10-21 DIAGNOSIS — G250 Essential tremor: Secondary | ICD-10-CM | POA: Diagnosis not present

## 2017-10-21 MED ORDER — PRIMIDONE 250 MG PO TABS: 250.0000 mg | ORAL_TABLET | Freq: Two times a day (BID) | ORAL | 4 refills | Status: DC

## 2017-10-21 NOTE — Progress Notes (Signed)
[facility]  PATIENT: Steven Rice DOB: 18-Jun-1951  REFERRING CLINICIAN:  HISTORY FROM: patient  REASON FOR VISIT: follow up    HISTORICAL  CHIEF COMPLAINT:  Chief Complaint  Patient presents with  . Follow-up  . Tremors    he is doing about the same, notices when more fatigued or stressed tremors worse.     HISTORY OF PRESENT ILLNESS:   UPDATE (10/21/17, VRP): Since last visit, doing about the same. Tremor stable. Tolerating meds. No alleviating or aggravating factors. No benefit with topiramate. He is interested in DBS surgery in [place], after discussing with his sister's friend who had the surgery done there for ET with good benefit.   UPDATE 04/16/17: Since last visit, doing well. Tremor stable. Tolerating primidone. Has considered DBS, but wants to hold off for now.   UPDATE 10/08/16: Since last visit, had evaluation at [facility] with Dr. Rubin Payor and Dr. Tempie Donning. Patient wants to hold off on surgery at this time. Tremor stable.   PRIOR HPI (07/03/16): 66 year old right-handed male here for evaluation of essential tremor. Patient reports onset of gradual, progressive postural and action tremor of bilateral upper studies around age 66 years old. No family history of tremor. Patient was diagnosed with essential tremor started on primidone. Patient not taking. On 200 mg daily. Sometimes he takes 2, 3 or 4 total tablets per day is under stressful situations. Patient has noted some side effects including erectile dysfunction, decreased libido, frequent urination which she attributes to primidone. Patient was evaluated by Dr. Sandria Manly and Darrol Angel in the past (from 2002 until last visit 08/18/12).  Patient is frustrated with progressive tremor. Patient tried propranolol at some point in the past but this caused severe side effects. It is affecting his ability to eat, do fine motor tasks, and making him self-conscious in public situations. He is interested in surgical  evaluation with deep brain stimulation. 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 Apparently his prior neurologist mention this could've been a factor in his development of tremor. No family history of tremor otherwise.   REVIEW OF SYSTEMS: Full 14 system review of systems performed and negative with exception of: tremors freq urination restless legs cramps.   ALLERGIES: Allergies  Allergen Reactions  . Hydrocodone-Acetaminophen Other (See Comments)    Reaction:  Insomnia     HOME MEDICATIONS: Outpatient Medications Prior to Visit  Medication Sig Dispense Refill  . amLODipine (NORVASC) 10 MG tablet Take 10 mg by mouth daily.     Marland Kitchen aspirin EC 81 MG tablet Take 81 mg by mouth daily.     . famotidine (PEPCID) 20 MG tablet Take 1 tablet (20 mg total) by mouth 2 (two) times daily. 10 tablet 0  . lisinopril (PRINIVIL,ZESTRIL) 20 MG tablet Take 20 mg by mouth daily.    . primidone (MYSOLINE) 250 MG tablet Take 1 tablet (250 mg total) by mouth 2 (two) times daily. 180 tablet 4  . topiramate (TOPAMAX) 50 MG tablet Take 1 tablet (50 mg total) by mouth 2 (two) times daily. 180 tablet 1  . vitamin C (ASCORBIC ACID) 500 MG tablet Take 500 mg by mouth daily.      No facility-administered medications prior to visit.     PAST MEDICAL HISTORY: Past Medical History:  Diagnosis Date  . Acid reflux   . ED (erectile dysfunction)    "from primidone"  . Hypertension   . Tremor     PAST SURGICAL HISTORY: Past  Surgical History:  Procedure Laterality Date  . ANKLE FRACTURE SURGERY Left 2010  . ROTATOR CUFF REPAIR Right    x 3  . SECONDARY INTRAOCULAR LENSE IMPLANTATION     Rt eye    FAMILY HISTORY: Family History  Problem Relation Age of Onset  . Emphysema Father   . Cancer Brother     SOCIAL HISTORY:  Social History   Socioeconomic History  . Marital status: Married    Spouse name: Not on file  . Number of children: Not on file  . Years of  education: Not on file  . Highest education level: Not on file  Social Needs  . Financial resource strain: Not on file  . Food insecurity - worry: Not on file  . Food insecurity - inability: Not on file  . Transportation needs - medical: Not on file  . Transportation needs - non-medical: Not on file  Occupational History  . Not on file  Tobacco Use  . Smoking status: Former Smoker    Last attempt to quit: 07/04/2003    Years since quitting: 14.3  . Smokeless tobacco: Never Used  Substance and Sexual Activity  . Alcohol use: No  . Drug use: No  . Sexual activity: Not on file  Other Topics Concern  . Not on file  Social History Narrative  . Not on file     PHYSICAL EXAM  GENERAL EXAM/CONSTITUTIONAL: Vitals:  Vitals:   10/21/17 1514  BP: 131/73  Pulse: (!) 55  Weight: 225 lb 3.2 oz (102.2 kg)  Height: 6\' 3"  (1.905 m)   Body mass index is 28.15 kg/m. No exam data present  Patient is in no distress; well developed, nourished and groomed; neck is supple  CARDIOVASCULAR:  Examination of carotid arteries is normal; no carotid bruits  Regular rate and rhythm, no murmurs  Examination of peripheral vascular system by observation and palpation is normal  EYES:  Ophthalmoscopic exam of optic discs and posterior segments is normal; no papilledema or hemorrhages  MUSCULOSKELETAL:  Gait, strength, tone, movements noted in Neurologic exam below  NEUROLOGIC: MENTAL STATUS:  No flowsheet data found.  awake, alert, oriented to person, place and time  recent and remote memory intact  normal attention and concentration  language fluent, comprehension intact, naming intact,   fund of knowledge appropriate  CRANIAL NERVE:   2nd - no papilledema on fundoscopic exam  2nd, 3rd, 4th, 6th - pupils equal and reactive to light, visual fields full to confrontation, extraocular muscles intact, no nystagmus  5th - facial sensation symmetric  7th - facial strength  symmetric  8th - hearing intact  9th - palate elevates symmetrically, uvula midline  11th - shoulder shrug symmetric  12th - tongue protrusion midline  MOTOR:   normal bulk and tone, full strength in the BUE, BLE  MILD POSTURAL AND ACTION TREMOR OF BUE   SENSORY:   normal and symmetric to light touch, temperature, vibration  COORDINATION:   finger-nose-finger, fine finger movements normal  REFLEXES:   deep tendon reflexes present and symmetric  GAIT/STATION:   narrow based gait    DIAGNOSTIC DATA (LABS, IMAGING, TESTING) - I reviewed patient records, labs, notes, testing and imaging myself where available.  Lab Results  Component Value Date   WBC 9.4 01/03/2017   HGB 14.3 01/03/2017   HCT 42.0 01/03/2017   MCV 89.2 01/03/2017   PLT 273 01/03/2017      Component Value Date/Time   NA 139 01/03/2017 0702  K 4.8 01/03/2017 0702   CL 103 01/03/2017 0702   CO2 23 01/03/2017 0637   GLUCOSE 106 (H) 01/03/2017 0702   BUN 10 01/03/2017 0702   CREATININE 0.80 01/03/2017 0702   CALCIUM 8.0 (L) 01/03/2017 0637   PROT 7.5 01/03/2017 0637   ALBUMIN 4.2 01/03/2017 0637   AST 30 01/03/2017 0637   ALT 38 01/03/2017 0637   ALKPHOS 47 01/03/2017 0637   BILITOT 0.4 01/03/2017 0637   GFRNONAA >60 01/03/2017 0637   GFRAA >60 01/03/2017 0637   No results found for: CHOL, HDL, LDLCALC, LDLDIRECT, TRIG, CHOLHDL No results found for: ZOXW9UHGBA1C No results found for: VITAMINB12 No results found for: TSH     ASSESSMENT AND PLAN  66 y.o. year old male here with history of essential tremor since age 66 years old, with suboptimal response to primidone. Patient tried and failed propranolol and gabapentin in the past. We discussed surgical options. He would like to hold off until after his mother passes away, due to her fear of a potential complication with the surgery.     Dx:  1. Essential tremor      PLAN:  I spent 15 minutes of face to face time with patient.  Greater than 50% of time was spent in counseling and coordination of care with patient. In summary we discussed:  - diagnosis, prognosis and treatment options  - continue primidone 250mg  twice a day for essential tremor  - taper off topiramate (50mg  daily x 1 week, then stop; due to lack of benefit)  - consider essential tremor treatment surgical options as needed if patient decides to pursue surgical treatment  Meds ordered this encounter  Medications  . primidone (MYSOLINE) 250 MG tablet    Sig: Take 1 tablet (250 mg total) by mouth 2 (two) times daily.    Dispense:  180 tablet    Refill:  4   Return in about 9 months (around 07/21/2018) for with NP Enid Skeens(C Martin).    Suanne MarkerVIKRAM R. PENUMALLI, MD 10/21/2017, 3:48 PM Certified in Neurology, Neurophysiology and Neuroimaging  [facility] [address], [address] [place], [place] [address] [phone]

## 2017-10-21 NOTE — Patient Instructions (Signed)
-   continue primidone 250mg  twice a day for essential tremor  - taper off topiramate (50mg  daily x 1 week, then stop)

## 2017-11-18 ENCOUNTER — Other Ambulatory Visit: Payer: Self-pay | Admitting: Orthopedic Surgery

## 2017-11-18 DIAGNOSIS — M7541 Impingement syndrome of right shoulder: Secondary | ICD-10-CM

## 2017-11-25 ENCOUNTER — Ambulatory Visit
Admission: RE | Admit: 2017-11-25 | Discharge: 2017-11-25 | Disposition: A | Discharge status: Home / Self Care | Payer: Medicare Other | Source: Ambulatory Visit | Attending: Orthopedic Surgery | Admitting: Orthopedic Surgery

## 2017-11-25 DIAGNOSIS — M7541 Impingement syndrome of right shoulder: Secondary | ICD-10-CM

## 2017-12-06 DIAGNOSIS — S46919A Strain of unspecified muscle, fascia and tendon at shoulder and upper arm level, unspecified arm, initial encounter: Secondary | ICD-10-CM | POA: Insufficient documentation

## 2017-12-06 DIAGNOSIS — M7540 Impingement syndrome of unspecified shoulder: Secondary | ICD-10-CM | POA: Insufficient documentation

## 2018-05-20 ENCOUNTER — Other Ambulatory Visit: Payer: Self-pay | Admitting: Sports Medicine

## 2018-05-20 ENCOUNTER — Ambulatory Visit (INDEPENDENT_AMBULATORY_CARE_PROVIDER_SITE_OTHER): Payer: Medicare Other

## 2018-05-20 ENCOUNTER — Ambulatory Visit (INDEPENDENT_AMBULATORY_CARE_PROVIDER_SITE_OTHER): Payer: Medicare Other | Admitting: Sports Medicine

## 2018-05-20 ENCOUNTER — Encounter: Payer: Self-pay | Admitting: Sports Medicine

## 2018-05-20 VITALS — BP 151/76 | HR 61 | Resp 16

## 2018-05-20 DIAGNOSIS — M722 Plantar fascial fibromatosis: Secondary | ICD-10-CM

## 2018-05-20 DIAGNOSIS — M79672 Pain in left foot: Secondary | ICD-10-CM

## 2018-05-20 MED ORDER — TRIAMCINOLONE ACETONIDE 10 MG/ML IJ SUSP: 10.0000 mg | Freq: Once | INTRAMUSCULAR | Status: AC

## 2018-05-20 NOTE — Patient Instructions (Signed)

## 2018-05-20 NOTE — Progress Notes (Signed)
Subjective: Steven Rice is a 67 y.o. male patient presents to office with complaint of moderate heel pain on the left. Patient admits to post static dyskinesia for 2 weeks in duration. Patient has treated this problem with changing shoes with a little relief. Pain 8/10. Admits that he has been doing lots of yard work. Denies any other pedal complaints.   Review of Systems  Musculoskeletal: Positive for joint pain.  All other systems reviewed and are negative.    Patient Active Problem List   Diagnosis Date Noted  . Impingement syndrome of shoulder region 12/06/2017  . Shoulder strain 12/06/2017  . Abdominal aortic aneurysm (AAA) 3.0 cm to 5.5 cm in diameter in male (HCC) 01/04/2017  . Fatty liver 01/04/2017  . Gallstones 01/04/2017  . Gastroesophageal reflux disease without esophagitis 01/04/2017  . Sigmoid diverticulosis 01/04/2017  . Essential tremor 07/03/2016  . Hypertension 05/26/2012  . 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 Tremor of both hands 11/07/2011  . 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 COPD (chronic obstructive pulmonary disease) with emphysema (HCC) 12/06/2005    Current Outpatient Medications on File Prior to Visit  Medication Sig Dispense Refill  . amLODipine (NORVASC) 10 MG tablet Take 10 mg by mouth daily.     Marland Kitchen aspirin EC 81 MG tablet Take 81 mg by mouth daily.     . famotidine (PEPCID) 20 MG tablet Take 1 tablet (20 mg total) by mouth 2 (two) times daily. 10 tablet 0  . lisinopril (PRINIVIL,ZESTRIL) 20 MG tablet Take 20 mg by mouth daily.    . primidone (MYSOLINE) 250 MG tablet Take 1 tablet (250 mg total) by mouth 2 (two) times daily. 180 tablet 4  . vitamin C (ASCORBIC ACID) 500 MG tablet Take 500 mg by mouth daily.      No current facility-administered medications on file prior to visit.     Allergies  Allergen Reactions  . Hydrocodone-Acetaminophen Other (See Comments)    Reaction:  Insomnia     Objective: Physical Exam General: The patient  is alert and oriented x3 in no acute distress.  Dermatology: Skin is warm, dry and supple bilateral lower extremities. Nails 1-10 are short and thick. There is no erythema, edema, no eccymosis, no open lesions present. Integument is otherwise unremarkable.  Vascular: Dorsalis Pedis pulse and Posterior Tibial pulse are 1/4 bilateral. Capillary fill time is immediate to all digits.  Neurological: Grossly intact to light touch with an achilles reflex of +2/5 and a  negative Tinel's sign bilateral.  Musculoskeletal: Tenderness to palpation at the medial calcaneal tubercale and through the insertion of the plantar fascia on the left foot. No pain with compression of calcaneus bilateral. No pain with tuning fork to calcaneus bilateral. No pain with calf compression bilateral. There is decreased Ankle joint range of motion bilateral. All other joints range of motion within normal limits bilateral. Strength 5/5 in all groups bilateral.   Gait: Unassisted, Antalgic avoid weight on left heel  Xray, Right/Left foot:  Normal osseous mineralization. Joint spaces preserved except and ankle and midtarsal joint where there is signs of arthritis. Hardware, previous ankle fracture intact. No fracture/dislocation/boney destruction. Calcaneal spur present with mild thickening of plantar fascia. No other soft tissue abnormalities or radiopaque foreign bodies.   Assessment and Plan: Problem List Items Addressed This Visit    None    Visit Diagnoses    Plantar fasciitis of left foot    -  Primary   Relevant Medications  triamcinolone acetonide (KENALOG) 10 MG/ML injection 10 mg   Left foot pain          -Complete examination performed.  -Xrays reviewed -Discussed with patient in detail the condition of plantar fasciitis, how this occurs and general treatment options. Explained both conservative and surgical treatments.  -After oral consent and aseptic prep, injected a mixture containing 1 ml of 2%  plain  lidocaine, 1 ml 0.5% plain marcaine, 0.5 ml of kenalog 10 and 0.5 ml of dexamethasone phosphate into left heel. Post-injection care discussed with patient.  -Recommended good supportive shoes and advised use of OTC insert. Explained in detail the use of the fascial brace for left which was dispensed at today's visit. -Explained and dispensed to patient daily stretching exercises. -Recommend patient to ice affected area 1-2x daily. -Patient to return to office after 4 weeks if pain is not better or sooner if problems or questions arise.  Asencion Islamitorya Xzaviar Maloof, DPM

## 2018-07-21 ENCOUNTER — Ambulatory Visit: Payer: Medicare Other | Admitting: Diagnostic Neuroimaging

## 2018-08-19 ENCOUNTER — Encounter: Payer: Self-pay | Admitting: Diagnostic Neuroimaging

## 2018-08-19 ENCOUNTER — Ambulatory Visit (INDEPENDENT_AMBULATORY_CARE_PROVIDER_SITE_OTHER): Payer: Medicare Other | Admitting: Diagnostic Neuroimaging

## 2018-08-19 VITALS — BP 142/78 | HR 65 | Ht 75.0 in | Wt 226.4 lb

## 2018-08-19 DIAGNOSIS — G250 Essential tremor: Secondary | ICD-10-CM | POA: Diagnosis not present

## 2018-08-19 MED ORDER — PRIMIDONE 250 MG PO TABS: 250.0000 mg | ORAL_TABLET | Freq: Two times a day (BID) | ORAL | 4 refills | Status: DC

## 2018-08-19 NOTE — Progress Notes (Signed)
[facility]  PATIENT: Steven Rice DOB: 05/11/1951  REFERRING CLINICIAN:  HISTORY FROM: patient  REASON FOR VISIT: follow up    HISTORICAL  CHIEF COMPLAINT:  Chief Complaint  Patient presents with  . Follow-up  . Tremors    doing about the same.      HISTORY OF PRESENT ILLNESS:   UPDATE (08/19/18, VRP): Since last visit, doing well. Symptoms are stable. Severity is mild. No alleviating or aggravating factors. Tolerating primidone. Not interested in DBS yet.   UPDATE (10/21/17, VRP): Since last visit, doing about the same. Tremor stable. Tolerating meds. No alleviating or aggravating factors. No benefit with topiramate. He is interested in DBS surgery in [place], after discussing with his sister's friend who had the surgery done there for ET with good benefit.   UPDATE 04/16/17: Since last visit, doing well. Tremor stable. Tolerating primidone. Has considered DBS, but wants to hold off for now.   UPDATE 10/08/16: Since last visit, had [facility] with Dr. Rubin PayorSiddiqui and Dr. Tempie DonningLaxton. Patient wants to hold off on surgery at this time. Tremor stable.   PRIOR HPI (07/03/16): 67 year old right-handed male here for evaluation of essential tremor. Patient reports onset of gradual, progressive postural and action tremor of bilateral upper studies around age 67 years old. No family history of tremor. Patient was diagnosed with essential tremor started on primidone. Patient not taking. On 200 mg daily. Sometimes he takes 2, 3 or 4 total tablets per day is under stressful situations. Patient has noted some side effects including erectile dysfunction, decreased libido, frequent urination which she attributes to primidone. Patient was evaluated by Dr. Sandria ManlyLove and Darrol Angelarolyn Martin in the past (from 2002 until last visit 08/18/12).  Patient is frustrated with progressive tremor. Patient tried propranolol at some point in the past but this caused severe side effects. It is affecting  his ability to eat, do fine motor tasks, and making him self-conscious in public situations. He is interested in surgical evaluation with deep brain stimulation. 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 Apparently his prior neurologist mention this could've been a factor in his development of tremor. No family history of tremor otherwise.   REVIEW OF SYSTEMS: Full 14 system review of systems performed and negative with exception of: tremors freq urination restless legs cramps.   ALLERGIES: Allergies  Allergen Reactions  . Hydrocodone-Acetaminophen Other (See Comments)    Reaction:  Insomnia     HOME MEDICATIONS: Outpatient Medications Prior to Visit  Medication Sig Dispense Refill  . amLODipine (NORVASC) 10 MG tablet Take 10 mg by mouth daily.     Marland Kitchen. aspirin EC 81 MG tablet Take 81 mg by mouth daily.     . famotidine (PEPCID) 20 MG tablet Take 1 tablet (20 mg total) by mouth 2 (two) times daily. 10 tablet 0  . lisinopril (PRINIVIL,ZESTRIL) 20 MG tablet Take 20 mg by mouth daily.    . primidone (MYSOLINE) 250 MG tablet Take 1 tablet (250 mg total) by mouth 2 (two) times daily. 180 tablet 4  . vitamin C (ASCORBIC ACID) 500 MG tablet Take 500 mg by mouth daily.      Facility-Administered Medications Prior to Visit  Medication Dose Route Frequency Provider Last Rate Last Dose  . triamcinolone acetonide (KENALOG) 10 MG/ML injection 10 mg  10 mg Other Once Asencion IslamStover, Titorya, DPM        PAST MEDICAL HISTORY: Past Medical History:  Diagnosis Date  . Acid  reflux   . ED (erectile dysfunction)    "from primidone"  . Hypertension   . Tremor     PAST SURGICAL HISTORY: Past Surgical History:  Procedure Laterality Date  . ANKLE FRACTURE SURGERY Left 2010  . ROTATOR CUFF REPAIR Right    x 3  . SECONDARY INTRAOCULAR LENSE IMPLANTATION     Rt eye    FAMILY HISTORY: Family History  Problem Relation Age of Onset  . Emphysema Father   . Cancer Brother      SOCIAL HISTORY:  Social History   Socioeconomic History  . Marital status: Married    Spouse name: Not on file  . Number of children: Not on file  . Years of education: Not on file  . Highest education level: Not on file  Occupational History  . Not on file  Social Needs  . Financial resource strain: Not on file  . Food insecurity:    Worry: Not on file    Inability: Not on file  . Transportation needs:    Medical: Not on file    Non-medical: Not on file  Tobacco Use  . Smoking status: Former Smoker    Last attempt to quit: 07/04/2003    Years since quitting: 15.1  . Smokeless tobacco: Never Used  Substance and Sexual Activity  . Alcohol use: No  . Drug use: No  . Sexual activity: Not on file  Lifestyle  . Physical activity:    Days per week: Not on file    Minutes per session: Not on file  . Stress: Not on file  Relationships  . Social connections:    Talks on phone: Not on file    Gets together: Not on file    Attends religious service: Not on file    Active member of club or organization: Not on file    Attends meetings of clubs or organizations: Not on file    Relationship status: Not on file  . Intimate partner violence:    Fear of current or ex partner: Not on file    Emotionally abused: Not on file    Physically abused: Not on file    Forced sexual activity: Not on file  Other Topics Concern  . Not on file  Social History Narrative  . Not on file     PHYSICAL EXAM  GENERAL EXAM/CONSTITUTIONAL: Vitals:  Vitals:   08/19/18 1003  BP: (!) 142/78  Pulse: 65  Weight: 226 lb 6.4 oz (102.7 kg)  Height: 6\' 3"  (1.905 m)   Body mass index is 28.3 kg/m. No exam data present  Patient is in no distress; well developed, nourished and groomed; neck is supple  CARDIOVASCULAR:  Examination of carotid arteries is normal; no carotid bruits  Regular rate and rhythm, no murmurs  Examination of peripheral vascular system by observation and palpation is  normal  EYES:  Ophthalmoscopic exam of optic discs and posterior segments is normal; no papilledema or hemorrhages  MUSCULOSKELETAL:  Gait, strength, tone, movements noted in Neurologic exam below  NEUROLOGIC: MENTAL STATUS:  No flowsheet data found.  awake, alert, oriented to person, place and time  recent and remote memory intact  normal attention and concentration  language fluent, comprehension intact, naming intact,   fund of knowledge appropriate  CRANIAL NERVE:   2nd - no papilledema on fundoscopic exam  2nd, 3rd, 4th, 6th - pupils equal and reactive to light, visual fields full to confrontation, extraocular muscles intact, no nystagmus  5th -  facial sensation symmetric  7th - facial strength symmetric  8th - hearing intact  9th - palate elevates symmetrically, uvula midline  11th - shoulder shrug symmetric  12th - tongue protrusion midline  MOTOR:   normal bulk and tone, full strength in the BUE, BLE  NO BRADYKINESIA; NO RIGIDITY  MILD POSTURAL AND ACTION TREMOR OF BUE   TREMOR WITH ARCHIMEDES SPIRAL (RIGHT WORSE THAN LEFT)  SENSORY:   normal and symmetric to light touch, temperature, vibration  COORDINATION:   finger-nose-finger, fine finger movements normal  REFLEXES:   deep tendon reflexes present and symmetric  GAIT/STATION:   narrow based gait; DECR RIGHT ARM SWING    DIAGNOSTIC DATA (LABS, IMAGING, TESTING) - I reviewed patient records, labs, notes, testing and imaging myself where available.  Lab Results  Component Value Date   WBC 9.4 01/03/2017   HGB 14.3 01/03/2017   HCT 42.0 01/03/2017   MCV 89.2 01/03/2017   PLT 273 01/03/2017      Component Value Date/Time   NA 139 01/03/2017 0702   K 4.8 01/03/2017 0702   CL 103 01/03/2017 0702   CO2 23 01/03/2017 0637   GLUCOSE 106 (H) 01/03/2017 0702   BUN 10 01/03/2017 0702   CREATININE 0.80 01/03/2017 0702   CALCIUM 8.0 (L) 01/03/2017 0637   PROT 7.5 01/03/2017 0637     ALBUMIN 4.2 01/03/2017 0637   AST 30 01/03/2017 0637   ALT 38 01/03/2017 0637   ALKPHOS 47 01/03/2017 0637   BILITOT 0.4 01/03/2017 0637   GFRNONAA >60 01/03/2017 0637   GFRAA >60 01/03/2017 0637   No results found for: CHOL, HDL, LDLCALC, LDLDIRECT, TRIG, CHOLHDL No results found for: ZOXW9U No results found for: VITAMINB12 No results found for: TSH     ASSESSMENT AND PLAN  67 y.o. year old male here with history of essential tremor since age 77 years old, with suboptimal response to primidone. Patient tried and failed propranolol and gabapentin in the past. We discussed surgical options. He would like to hold off until after his mother passes away, due to her fear of a potential complication with the surgery.    Dx:  1. Essential tremor     PLAN:  ESSENTIAL TREMOR (stable) - continue primidone 250mg  twice a day for essential tremor - consider DBS for ET; discussed dx, prognosis and treatment options.   Meds ordered this encounter  Medications  . primidone (MYSOLINE) 250 MG tablet    Sig: Take 1 tablet (250 mg total) by mouth 2 (two) times daily.    Dispense:  180 tablet    Refill:  4   Return in about 1 year (around 08/20/2019).    Suanne Marker, MD 08/19/2018, 10:14 AM Certified in Neurology, Neurophysiology and Neuroimaging  [facility] [address] [place], [place] [address] [phone]

## 2019-08-04 ENCOUNTER — Telehealth: Payer: Self-pay | Admitting: Diagnostic Neuroimaging

## 2019-08-04 NOTE — Telephone Encounter (Signed)
I have called this patient regarding rescheduling their 08/25/19 appointment due to their provider being out of office this day. Requested patient call back to reschedule.

## 2019-08-25 ENCOUNTER — Ambulatory Visit: Payer: Medicare Other | Admitting: Diagnostic Neuroimaging

## 2019-09-01 ENCOUNTER — Ambulatory Visit (INDEPENDENT_AMBULATORY_CARE_PROVIDER_SITE_OTHER): Payer: Medicare Other | Admitting: Diagnostic Neuroimaging

## 2019-09-01 ENCOUNTER — Other Ambulatory Visit: Payer: Self-pay

## 2019-09-01 ENCOUNTER — Encounter: Payer: Self-pay | Admitting: Diagnostic Neuroimaging

## 2019-09-01 VITALS — BP 152/78 | HR 63 | Temp 97.5°F | Ht 75.0 in | Wt 224.4 lb

## 2019-09-01 DIAGNOSIS — G250 Essential tremor: Secondary | ICD-10-CM

## 2019-09-01 MED ORDER — PRIMIDONE 250 MG PO TABS: 250.0000 mg | ORAL_TABLET | Freq: Two times a day (BID) | ORAL | 4 refills | Status: DC

## 2019-09-01 NOTE — Progress Notes (Signed)
[facility]  PATIENT: Steven Rice DOB: 1951-06-24  REFERRING CLINICIAN:  HISTORY FROM: patient  REASON FOR VISIT: follow up    HISTORICAL  CHIEF COMPLAINT:  Chief Complaint  Patient presents with   Essential Tremor    rm 7, one year FU, "no change in tremors, no new concerns"    HISTORY OF PRESENT ILLNESS:   UPDATE (09/01/19, VRP): Since last visit, doing about the same. Symptoms are stable. Severity is moderate. No alleviating or aggravating factors. Tolerating primidone 500mg  in AM.    UPDATE (08/19/18, VRP): Since last visit, doing well. Symptoms are stable. Severity is mild. No alleviating or aggravating factors. Tolerating primidone. Not interested in DBS yet.   UPDATE (10/21/17, VRP): Since last visit, doing about the same. Tremor stable. Tolerating meds. No alleviating or aggravating factors. No benefit with topiramate. He is interested in DBS surgery in [place], after discussing with his sister's friend who had the surgery done there for ET with good benefit.   UPDATE 04/16/17: Since last visit, doing well. Tremor stable. Tolerating primidone. Has considered DBS, but wants to hold off for now.   UPDATE 10/08/16: Since last visit, had evaluation at [facility] with Dr. Linus Mako and Dr. Arlan Organ. Patient wants to hold off on surgery at this time. Tremor stable.   PRIOR HPI (07/03/16): 68 year old right-handed male here for evaluation of essential tremor. Patient reports onset of gradual, progressive postural and action tremor of bilateral upper studies around age 53 years old. No family history of tremor. Patient was diagnosed with essential tremor started on primidone. Patient not taking. On 200 mg daily. Sometimes he takes 2, 3 or 4 total tablets per day is under stressful situations. Patient has noted some side effects including erectile dysfunction, decreased libido, frequent urination which she attributes to primidone. Patient was evaluated by Dr. Erling Cruz and  Cecille Rubin in the past (from 2002 until last visit 08/18/12).  Patient is frustrated with progressive tremor. Patient tried propranolol at some point in the past but this caused severe side effects. It is affecting his ability to eat, do fine motor tasks, and making him self-conscious in public situations. He is interested in surgical evaluation with deep brain stimulation. 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 Apparently his prior neurologist mention this could've been a factor in his development of tremor. No family history of tremor otherwise.   REVIEW OF SYSTEMS: Full 14 system review of systems performed and negative with exception of: tremors freq urination restless legs cramps.   ALLERGIES: Allergies  Allergen Reactions   Hydrocodone-Acetaminophen Other (See Comments)    Reaction:  Insomnia     HOME MEDICATIONS: Outpatient Medications Prior to Visit  Medication Sig Dispense Refill   amLODipine (NORVASC) 10 MG tablet Take 10 mg by mouth daily.      aspirin EC 81 MG tablet Take 81 mg by mouth daily.      atorvastatin (LIPITOR) 40 MG tablet TAKE 1 TABLET BY MOUTH EVERY DAY FOR CHOLESTEROL     cyanocobalamin (,VITAMIN B-12,) 1000 MCG/ML injection Inject into the muscle.     famotidine (PEPCID) 20 MG tablet Take 1 tablet (20 mg total) by mouth 2 (two) times daily. 10 tablet 0   lisinopril (PRINIVIL,ZESTRIL) 20 MG tablet Take 20 mg by mouth daily.     primidone (MYSOLINE) 250 MG tablet Take 1 tablet (250 mg total) by mouth 2 (two) times daily. 180 tablet 4   vitamin C (ASCORBIC ACID) 500  MG tablet Take 500 mg by mouth daily.      Facility-Administered Medications Prior to Visit  Medication Dose Route Frequency Provider Last Rate Last Dose   triamcinolone acetonide (KENALOG) 10 MG/ML injection 10 mg  10 mg Other Once Asencion Islam, DPM        PAST MEDICAL HISTORY: Past Medical History:  Diagnosis Date   Acid reflux    ED (erectile  dysfunction)    "from primidone"   Hypertension    Tremor     PAST SURGICAL HISTORY: Past Surgical History:  Procedure Laterality Date   ANKLE FRACTURE SURGERY Left 2010   ROTATOR CUFF REPAIR Right    x 3   SECONDARY INTRAOCULAR LENSE IMPLANTATION     Rt eye    FAMILY HISTORY: Family History  Problem Relation Age of Onset   Emphysema Father    Cancer Brother     SOCIAL HISTORY:  Social History   Socioeconomic History   Marital status: Married    Spouse name: Not on file   Number of children: Not on file   Years of education: Not on file   Highest education level: Not on file  Occupational History   Not on file  Social Needs   Financial resource strain: Not on file   Food insecurity    Worry: Not on file    Inability: Not on file   Transportation needs    Medical: Not on file    Non-medical: Not on file  Tobacco Use   Smoking status: Former Smoker    Quit date: 07/04/2003    Years since quitting: 16.1   Smokeless tobacco: Never Used  Substance and Sexual Activity   Alcohol use: No   Drug use: No   Sexual activity: Not on file  Lifestyle   Physical activity    Days per week: Not on file    Minutes per session: Not on file   Stress: Not on file  Relationships   Social connections    Talks on phone: Not on file    Gets together: Not on file    Attends religious service: Not on file    Active member of club or organization: Not on file    Attends meetings of clubs or organizations: Not on file    Relationship status: Not on file   Intimate partner violence    Fear of current or ex partner: Not on file    Emotionally abused: Not on file    Physically abused: Not on file    Forced sexual activity: Not on file  Other Topics Concern   Not on file  Social History Narrative   Not on file     PHYSICAL EXAM  GENERAL EXAM/CONSTITUTIONAL: Vitals:  Vitals:   09/01/19 0811  BP: (!) 152/78  Pulse: 63  Temp: (!) 97.5 F (36.4  C)  Weight: 224 lb 6.4 oz (101.8 kg)  Height: 6\' 3"  (1.905 m)   Body mass index is 28.05 kg/m. No exam data present  Patient is in no distress; well developed, nourished and groomed; neck is supple  CARDIOVASCULAR:  Examination of carotid arteries is normal; no carotid bruits  Regular rate and rhythm, no murmurs  Examination of peripheral vascular system by observation and palpation is normal  EYES:  Ophthalmoscopic exam of optic discs and posterior segments is normal; no papilledema or hemorrhages  MUSCULOSKELETAL:  Gait, strength, tone, movements noted in Neurologic exam below  NEUROLOGIC: MENTAL STATUS:  No flowsheet data found.  awake, alert, oriented to person, place and time  recent and remote memory intact  normal attention and concentration  language fluent, comprehension intact, naming intact,   fund of knowledge appropriate  CRANIAL NERVE:   2nd - no papilledema on fundoscopic exam  2nd, 3rd, 4th, 6th - pupils equal and reactive to light, visual fields full to confrontation, extraocular muscles intact, no nystagmus  5th - facial sensation symmetric  7th - facial strength symmetric  8th - hearing intact  9th - palate elevates symmetrically, uvula midline  11th - shoulder shrug symmetric  12th - tongue protrusion midline  MOTOR:   normal bulk and tone, full strength in the BUE, BLE  NO BRADYKINESIA; NO RIGIDITY  MILD POSTURAL AND ACTION TREMOR OF BUE    SENSORY:   normal and symmetric to light touch  COORDINATION:   finger-nose-finger, fine finger movements normal  REFLEXES:   deep tendon reflexes present and symmetric  GAIT/STATION:   narrow based gait    DIAGNOSTIC DATA (LABS, IMAGING, TESTING) - I reviewed patient records, labs, notes, testing and imaging myself where available.  Lab Results  Component Value Date   WBC 9.4 01/03/2017   HGB 14.3 01/03/2017   HCT 42.0 01/03/2017   MCV 89.2 01/03/2017   PLT 273  01/03/2017      Component Value Date/Time   NA 139 01/03/2017 0702   K 4.8 01/03/2017 0702   CL 103 01/03/2017 0702   CO2 23 01/03/2017 0637   GLUCOSE 106 (H) 01/03/2017 0702   BUN 10 01/03/2017 0702   CREATININE 0.80 01/03/2017 0702   CALCIUM 8.0 (L) 01/03/2017 0637   PROT 7.5 01/03/2017 0637   ALBUMIN 4.2 01/03/2017 0637   AST 30 01/03/2017 0637   ALT 38 01/03/2017 0637   ALKPHOS 47 01/03/2017 0637   BILITOT 0.4 01/03/2017 0637   GFRNONAA >60 01/03/2017 0637   GFRAA >60 01/03/2017 0637   No results found for: CHOL, HDL, LDLCALC, LDLDIRECT, TRIG, CHOLHDL No results found for: [id number] No results found for: VITAMINB12 No results found for: TSH     ASSESSMENT AND PLAN  68 y.o. year old male here with history of essential tremor since age 68 years old, with suboptimal response to primidone. Patient tried and failed propranolol and gabapentin in the past. We discussed surgical options. He would like to hold off until after his mother passes away, due to her fear of a potential complication with the surgery.    Dx:  1. Essential tremor     PLAN:  ESSENTIAL TREMOR (stable) - continue primidone 250mg  twice a day for essential tremor (currently taking 500mg  in AM) - consider DBS for ET; discussed dx, prognosis and treatment options.   Meds ordered this encounter  Medications   primidone (MYSOLINE) 250 MG tablet    Sig: Take 1 tablet (250 mg total) by mouth 2 (two) times daily.    Dispense:  180 tablet    Refill:  4   Return in about 1 year (around 08/31/2020) for with NP (Amy Lomax).    Suanne MarkerVIKRAM R. Adalis Gatti, MD 09/01/2019, 8:52 AM Certified in Neurology, Neurophysiology and Neuroimaging  [facility] [address], [address] [place], [place] [address] [phone]

## 2019-09-22 IMAGING — MR MR SHOULDER*R* W/O CM
4 of 5 series · 27 of 40 positions shown · non-contrast
Comparison: MRI dated 01/14/2008

CLINICAL DATA: Right shoulder pain with limited range of motion and
weakness. Three prior rotator cuff surgeries.

EXAM:
MRI OF THE RIGHT SHOULDER WITHOUT CONTRAST
TECHNIQUE: Multiplanar, multisequence MR imaging of the shoulder was performed.
No intravenous contrast was administered.

[Series 3: T2 fat-sat · axial · 4.0mm · 0.27mm/px · z∈[-41,+53]mm · 8 of 22 slices shown (1 of 3)]
[im 1/22]
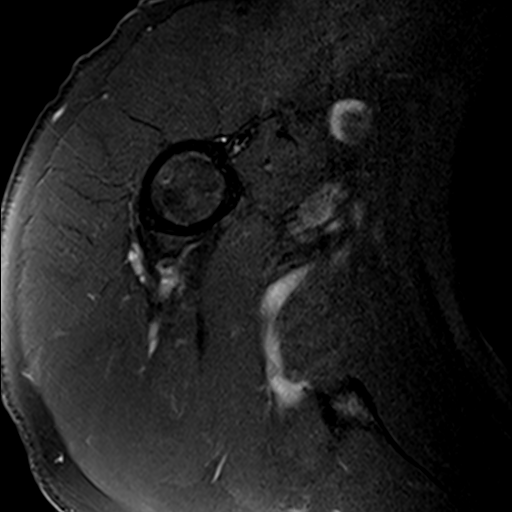
[im 4/22]
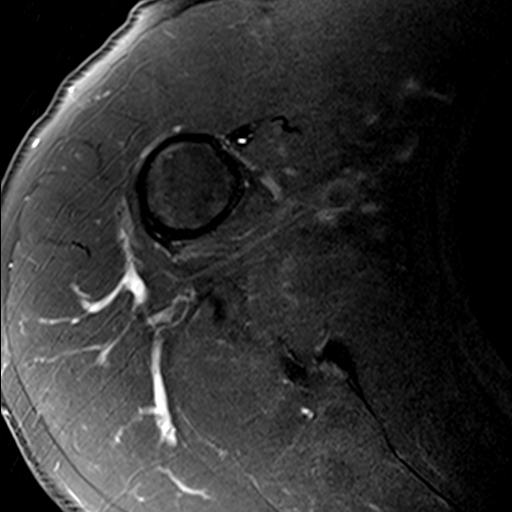
[im 7/22]
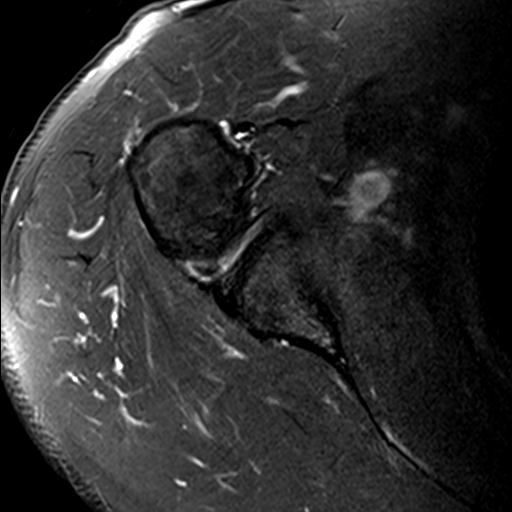
[im 10/22]
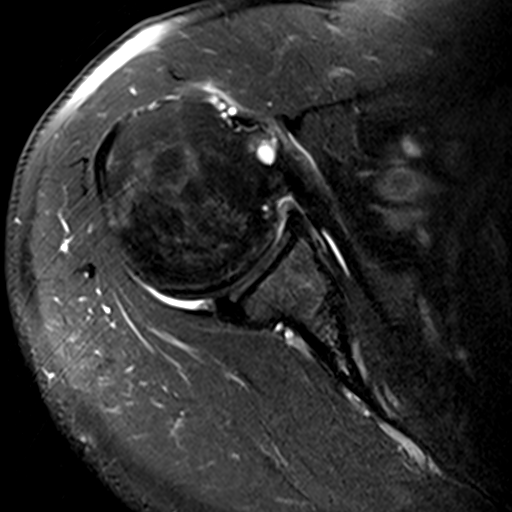
[im 13/22]
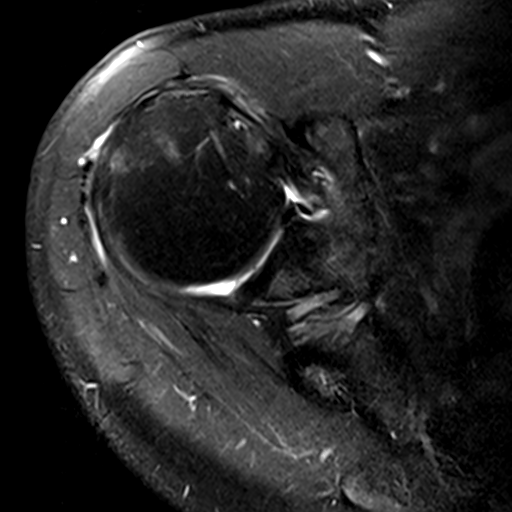
[im 16/22]
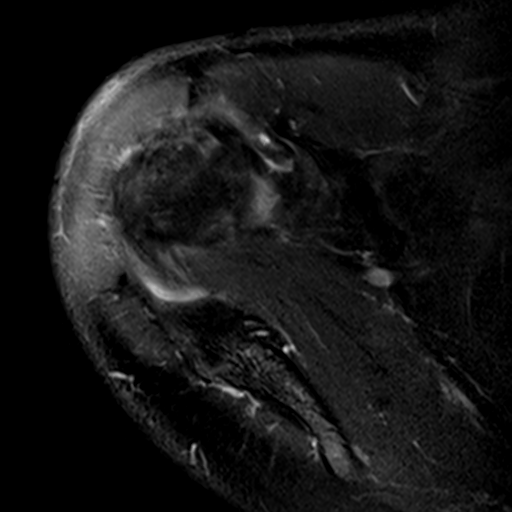
[im 19/22]
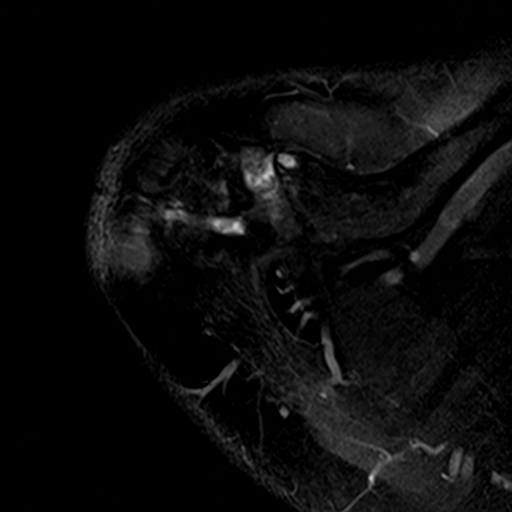
[im 22/22]
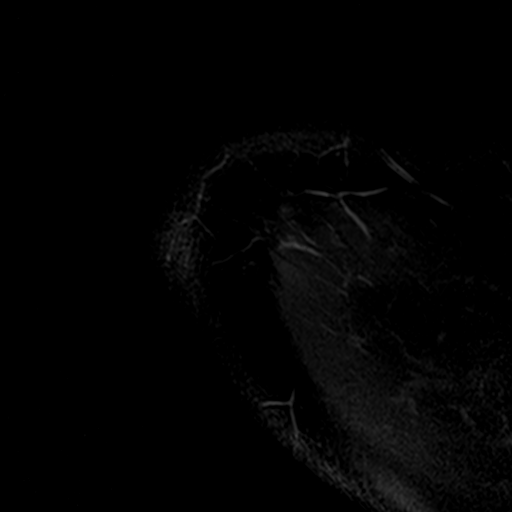

[Series 4: T2 fat-sat · oblique · 4.0mm · 0.55mm/px · 8 of 19 slices shown (2 of 3)]
[im 1/19]
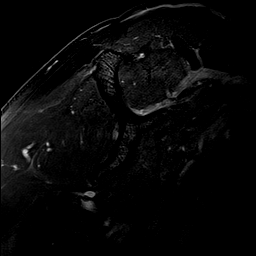
[im 3/19]
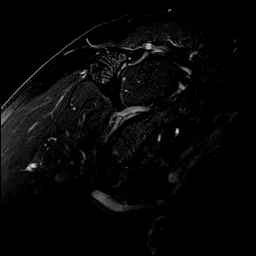
[im 6/19]
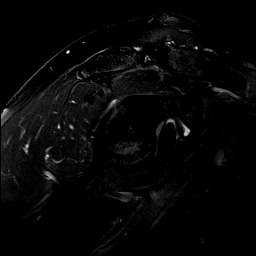
[im 8/19]
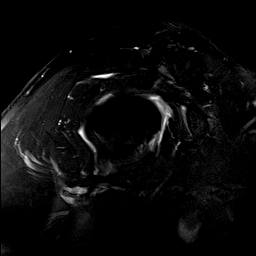
[im 11/19]
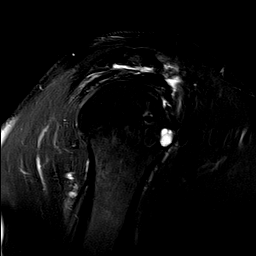
[im 13/19]
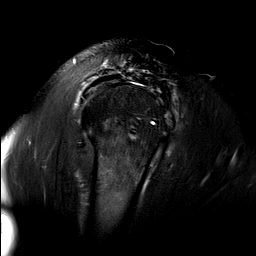
[im 16/19]
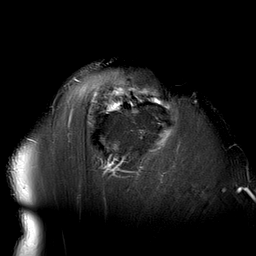
[im 19/19]
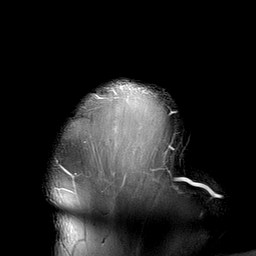

[Series 7: PD · oblique · 4.0mm · 0.27mm/px · 8 of 18 slices shown]
[im 1/18]
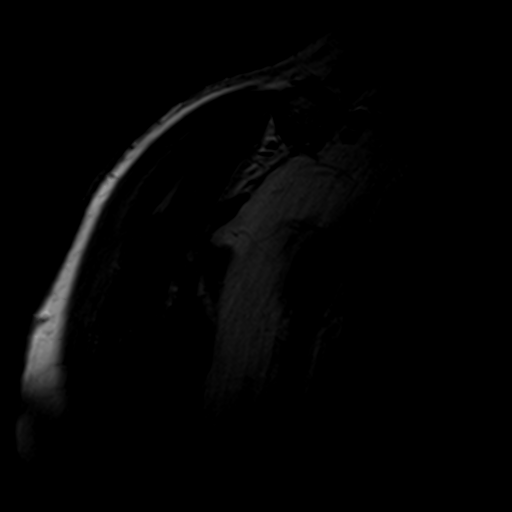
[im 3/18]
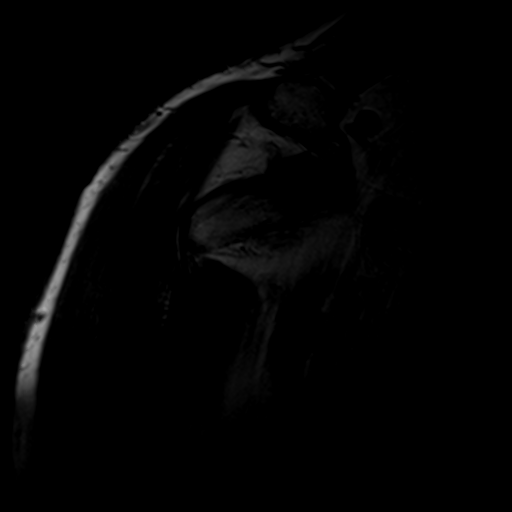
[im 5/18]
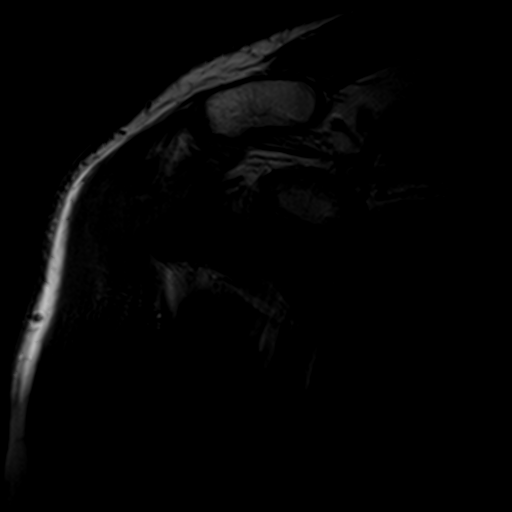
[im 8/18]
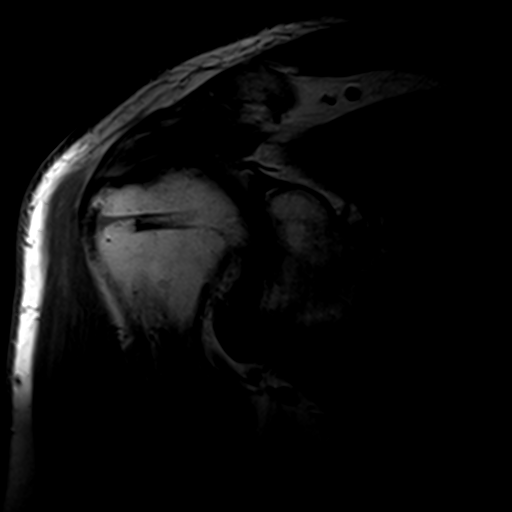
[im 10/18]
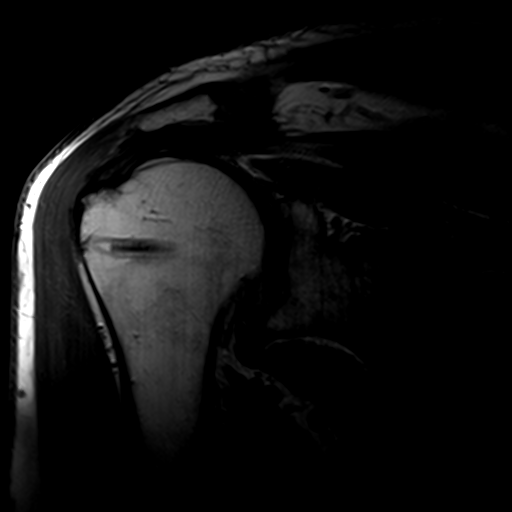
[im 13/18]
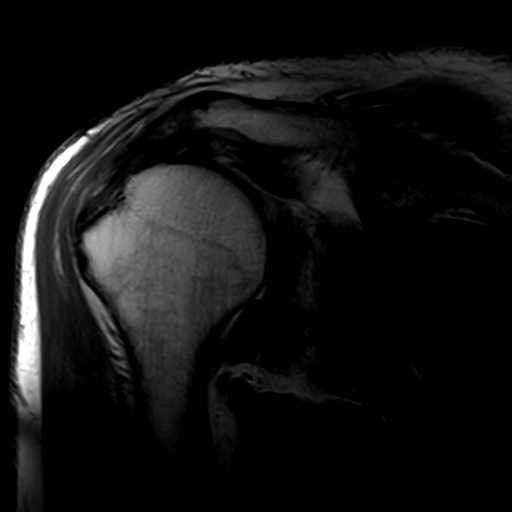
[im 15/18]
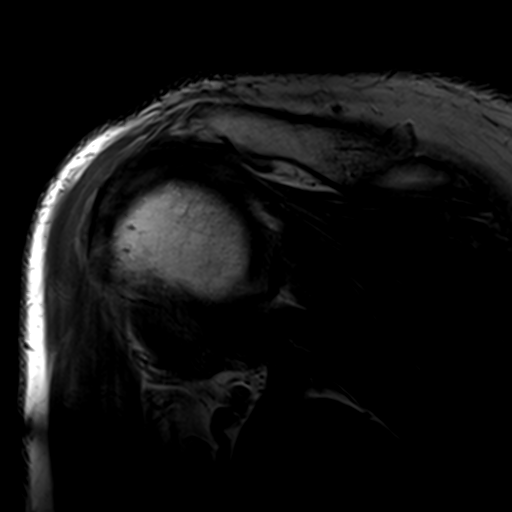
[im 18/18]
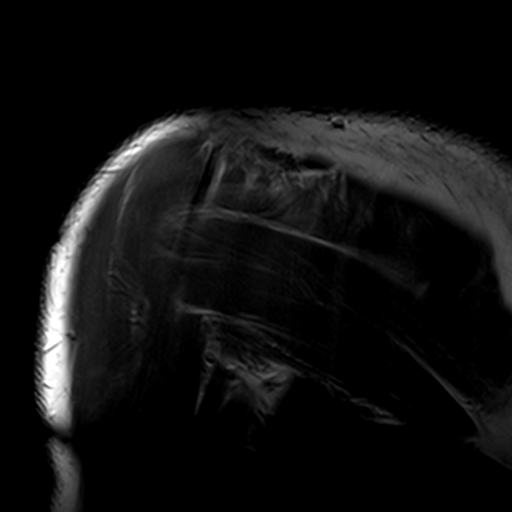

[Series 8: T2 fat-sat · oblique · 4.0mm · 0.55mm/px · 3 of 18 slices shown (3 of 3)]
[im 3/18]
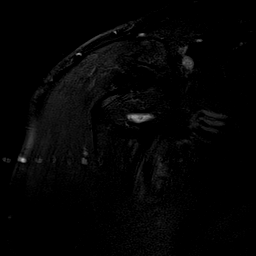
[im 10/18]
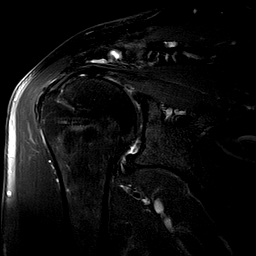
[im 15/18]
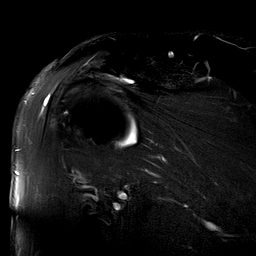

[27 of 40 positions shown; findings below may reference images not displayed]

FINDINGS: Rotator cuff: There is a small focal non retracted full-thickness
tear of the anterior aspect of the distal supraspinatus tendon seen
best on image 11 of series 8 and image 16 of series 4. There are
focal degenerative changes of the distal infraspinatus tendon
without a tear. Teres minor and subscapularis tendons are intact.

Muscles: No atrophy or abnormal signal of the muscles of the rotator
cuff.

Biceps long head: Properly located. The tendon is diminutive and
flattened at the superior aspect of the bicipital groove.

Acromioclavicular Joint: Os acromiale. Focal deformity in soft
tissue calcifications adjacent to the distal clavicle consistent
with prior surgery.

Glenohumeral Joint: No significant abnormality.

Labrum:  Intact.

Bones: Surgical anchors in the humeral head from previous rotator
cuff repair. 9 mm degenerative cyst in the lesser tuberosity.

Other: None
IMPRESSION: Small full-thickness non retracted tear of the anterior aspect of
the distal supraspinatus tendon.

## 2019-09-30 ENCOUNTER — Other Ambulatory Visit: Payer: Self-pay

## 2019-09-30 DIAGNOSIS — Z20822 Contact with and (suspected) exposure to covid-19: Secondary | ICD-10-CM

## 2019-10-01 LAB — NOVEL CORONAVIRUS, NAA: SARS-CoV-2, NAA: DETECTED — AB

## 2019-10-14 ENCOUNTER — Other Ambulatory Visit: Payer: Self-pay

## 2019-10-14 DIAGNOSIS — Z20822 Contact with and (suspected) exposure to covid-19: Secondary | ICD-10-CM

## 2019-10-16 LAB — NOVEL CORONAVIRUS, NAA: SARS-CoV-2, NAA: NOT DETECTED

## 2019-12-18 ENCOUNTER — Ambulatory Visit: Payer: Medicare Other | Attending: Internal Medicine

## 2019-12-18 DIAGNOSIS — Z23 Encounter for immunization: Secondary | ICD-10-CM | POA: Insufficient documentation

## 2019-12-18 NOTE — Progress Notes (Signed)
   Covid-19 Vaccination Clinic  Name:  Steven Rice    MRN: [id number] DOB: 1951-07-03  12/18/2019  Steven Rice was observed post Covid-19 immunization for 15 minutes without incidence. He was provided with Vaccine Information Sheet and instruction to access the V-Safe system.   Steven Rice was instructed to call 911 with any severe reactions post vaccine: Marland Kitchen Difficulty breathing  . Swelling of your face and throat  . A fast heartbeat  . A bad rash all over your body  . Dizziness and weakness    Immunizations Administered    Name Date Dose VIS Date Route   Pfizer COVID-19 Vaccine 12/18/2019 10:56 AM 0.3 mL 11/06/2019 Intramuscular   Manufacturer: [employer], [employer]   Lot: [id number]   NDC: [address]-2

## 2020-01-08 ENCOUNTER — Other Ambulatory Visit: Payer: Self-pay

## 2020-01-08 ENCOUNTER — Ambulatory Visit: Payer: Medicare Other | Attending: Internal Medicine

## 2020-01-08 DIAGNOSIS — Z23 Encounter for immunization: Secondary | ICD-10-CM | POA: Insufficient documentation

## 2020-01-08 NOTE — Progress Notes (Signed)
   Covid-19 Vaccination Clinic  Name:  Steven Rice    MRN: [id number] DOB: [birth year]  01/08/2020  Mr. Longmore was observed post Covid-19 immunization for 15 minutes without incidence. He was provided with Vaccine Information Sheet and instruction to access the V-Safe system.   Mr. Kinner was instructed to call 911 with any severe reactions post vaccine: Marland Kitchen Difficulty breathing  . Swelling of your face and throat  . A fast heartbeat  . A bad rash all over your body  . Dizziness and weakness    Immunizations Administered    Name Date Dose VIS Date Route   Pfizer COVID-19 Vaccine 01/08/2020  2:09 PM 0.3 mL 11/06/2019 Intramuscular   Manufacturer: [employer], [employer]   Lot: [id number]   NDC: [id number]

## 2020-01-21 ENCOUNTER — Other Ambulatory Visit: Payer: Self-pay | Admitting: Family Medicine

## 2020-01-21 DIAGNOSIS — I714 Abdominal aortic aneurysm, without rupture, unspecified: Secondary | ICD-10-CM

## 2020-01-29 ENCOUNTER — Ambulatory Visit
Admission: RE | Admit: 2020-01-29 | Discharge: 2020-01-29 | Disposition: A | Discharge status: Home / Self Care | Payer: Medicare Other | Source: Ambulatory Visit | Attending: Family Medicine | Admitting: Family Medicine

## 2020-01-29 DIAGNOSIS — I714 Abdominal aortic aneurysm, without rupture, unspecified: Secondary | ICD-10-CM

## 2020-08-30 ENCOUNTER — Encounter: Payer: Self-pay | Admitting: Family Medicine

## 2020-08-30 ENCOUNTER — Ambulatory Visit (INDEPENDENT_AMBULATORY_CARE_PROVIDER_SITE_OTHER): Payer: Medicare Other | Admitting: Family Medicine

## 2020-08-30 VITALS — BP 145/78 | HR 66 | Ht 74.0 in | Wt 224.0 lb

## 2020-08-30 DIAGNOSIS — G250 Essential tremor: Secondary | ICD-10-CM

## 2020-08-30 MED ORDER — PRIMIDONE 250 MG PO TABS
250.0000 mg | ORAL_TABLET | Freq: Two times a day (BID) | ORAL | 4 refills | Status: AC
Start: 2020-08-30 — End: ?

## 2020-08-30 NOTE — Progress Notes (Signed)
I reviewed note and agree with plan.   Suanne Marker, MD 08/30/2020, 4:56 PM Certified in Neurology, Neurophysiology and Neuroimaging  [facility] [address], [address] [place], [place] [address] [phone]

## 2020-08-30 NOTE — Progress Notes (Signed)
PATIENT: Steven Rice DOB: 05/20/1951  REASON FOR VISIT: follow up HISTORY FROM: patient  Chief Complaint  Patient presents with  . Follow-up    rm 5  . Tremors    pt said he has no new concerns.     HISTORY OF PRESENT ILLNESS: Today 08/30/20 Steven Rice is a 69 y.o. male here today for follow up for essential tremor. He feels tremor is stable. He is unsure if primidone works as well as he would like but feels it is the best option for him. He has checked into DBS but is not interested at this time. His mother was recently diagnosed with dementia. He is concerned this was brought on by Covid vaccine.   HISTORY: (copied from Dr Richrd Humbles note on 09/01/2019)  UPDATE (09/01/19, VRP): Since last visit, doing about the same. Symptoms are stable. Severity is moderate. No alleviating or aggravating factors. Tolerating primidone 500mg  in AM.    UPDATE (08/19/18, VRP): Since last visit, doing well. Symptoms are stable. Severity is mild. No alleviating or aggravating factors. Tolerating primidone. Not interested in DBS yet.   UPDATE (10/21/17, VRP): Since last visit, doing about the same. Tremor stable. Tolerating meds. No alleviating or aggravating factors. No benefit with topiramate. He is interested in DBS surgery in [place], after discussing with his sister's friend who had the surgery done there for ET with good benefit.   UPDATE 04/16/17: Since last visit, doing well. Tremor stable. Tolerating primidone. Has considered DBS, but wants to hold off for now.   UPDATE 10/08/16: Since last visit, had evaluation at [facility] and Dr. Rubin Payor. Patient wants to hold off on surgery at this time. Tremor stable.   PRIOR HPI (07/03/16): 69 year old right-handed male here for evaluation of essential tremor. Patient reports onset of gradual, progressive postural and action tremor of bilateral upper studies around age 42 years old. No family history of tremor. Patient was diagnosed  with essential tremor started on primidone. Patient not taking. On 200 mg daily. Sometimes he takes 2, 3 or 4 total tablets per day is under stressful situations. Patient has noted some side effects including erectile dysfunction, decreased libido, frequent urination which she attributes to primidone. Patient was evaluated by Dr. 41 and Sandria Manly in the past (from 2002 until last visit 08/18/12).  Patient is frustrated with progressive tremor. Patient tried propranolol at some point in the past but this caused severe side effects. It is affecting his ability to eat, do fine motor tasks, and making him self-conscious in public situations. He is interested in surgical evaluation with deep brain stimulation. 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 Apparently his prior neurologist mention this could've been a factor in his development of tremor. No family history of tremor otherwise.    REVIEW OF SYSTEMS: Out of a complete 14 system review of symptoms, the patient complains only of the following symptoms, tremor and all other reviewed systems are negative.  ALLERGIES: Allergies  Allergen Reactions  . Hydrocodone-Acetaminophen Other (See Comments)    Reaction:  Insomnia     HOME MEDICATIONS: Outpatient Medications Prior to Visit  Medication Sig Dispense Refill  . amLODipine (NORVASC) 10 MG tablet Take 10 mg by mouth daily.     01-21-1995 aspirin EC 81 MG tablet Take 81 mg by mouth daily.     Marland Kitchen atorvastatin (LIPITOR) 40 MG tablet TAKE 1 TABLET BY MOUTH EVERY DAY FOR CHOLESTEROL    .  cyanocobalamin (,VITAMIN B-12,) 1000 MCG/ML injection Inject into the muscle.    . famotidine (PEPCID) 20 MG tablet Take 1 tablet (20 mg total) by mouth 2 (two) times daily. 10 tablet 0  . lisinopril (PRINIVIL,ZESTRIL) 20 MG tablet Take 20 mg by mouth daily.    . vitamin C (ASCORBIC ACID) 500 MG tablet Take 500 mg by mouth daily.     . primidone (MYSOLINE) 250 MG tablet Take 1 tablet (250  mg total) by mouth 2 (two) times daily. 180 tablet 4   Facility-Administered Medications Prior to Visit  Medication Dose Route Frequency Provider Last Rate Last Admin  . triamcinolone acetonide (KENALOG) 10 MG/ML injection 10 mg  10 mg Other Once Asencion Islam, DPM        PAST MEDICAL HISTORY: Past Medical History:  Diagnosis Date  . Acid reflux   . ED (erectile dysfunction)    "from primidone"  . Hypertension   . Tremor     PAST SURGICAL HISTORY: Past Surgical History:  Procedure Laterality Date  . ANKLE FRACTURE SURGERY Left 2010  . ROTATOR CUFF REPAIR Right    x 3  . SECONDARY INTRAOCULAR LENSE IMPLANTATION     Rt eye    FAMILY HISTORY: Family History  Problem Relation Age of Onset  . Emphysema Father   . Cancer Brother     SOCIAL HISTORY: Social History   Socioeconomic History  . Marital status: Married    Spouse name: Not on file  . Number of children: Not on file  . Years of education: Not on file  . Highest education level: Not on file  Occupational History  . Not on file  Tobacco Use  . Smoking status: Former Smoker    Quit date: 07/04/2003    Years since quitting: 17.1  . Smokeless tobacco: Never Used  Substance and Sexual Activity  . Alcohol use: No  . Drug use: No  . Sexual activity: Not on file  Other Topics Concern  . Not on file  Social History Narrative  . Not on file   Social Determinants of Health   Financial Resource Strain:   . Difficulty of Paying Living Expenses: Not on file  Food Insecurity:   . Worried About Programme researcher, broadcasting/film/video in the Last Year: Not on file  . Ran Out of Food in the Last Year: Not on file  Transportation Needs:   . Lack of Transportation (Medical): Not on file  . Lack of Transportation (Non-Medical): Not on file  Physical Activity:   . Days of Exercise per Week: Not on file  . Minutes of Exercise per Session: Not on file  Stress:   . Feeling of Stress : Not on file  Social Connections:   . Frequency of  Communication with Friends and Family: Not on file  . Frequency of Social Gatherings with Friends and Family: Not on file  . Attends Religious Services: Not on file  . Active Member of Clubs or Organizations: Not on file  . Attends Banker Meetings: Not on file  . Marital Status: Not on file  Intimate Partner Violence:   . Fear of Current or Ex-Partner: Not on file  . Emotionally Abused: Not on file  . Physically Abused: Not on file  . Sexually Abused: Not on file      PHYSICAL EXAM  Vitals:   08/30/20 1411 08/30/20 1413  BP: (!) 161/76 (!) 145/78  Pulse: 62 66  Weight: 224 lb (101.6 kg)  Height: 6\' 2"  (1.88 m)    Body mass index is 28.76 kg/m.  Generalized: Well developed, in no acute distress  Cardiology: normal rate and rhythm, no murmur noted Respiratory: clear to auscultation bilaterally  Neurological examination  Mentation: Alert oriented to time, place, history taking. Follows all commands speech and language fluent Cranial nerve II-XII: Pupils were equal round reactive to light. Extraocular movements were full, visual field were full on confrontational test. Facial sensation and strength were normal.  Head turning and shoulder shrug  were normal and symmetric. Motor: The motor testing reveals 5 over 5 strength of all 4 extremities. Good symmetric motor tone is noted throughout. No tremor noted on today's exam Gait and station: Gait is normal.   DIAGNOSTIC DATA (LABS, IMAGING, TESTING) - I reviewed patient records, labs, notes, testing and imaging myself where available.  No flowsheet data found.   Lab Results  Component Value Date   WBC 9.4 01/03/2017   HGB 14.3 01/03/2017   HCT 42.0 01/03/2017   MCV 89.2 01/03/2017   PLT 273 01/03/2017      Component Value Date/Time   NA 139 01/03/2017 0702   K 4.8 01/03/2017 0702   CL 103 01/03/2017 0702   CO2 23 01/03/2017 0637   GLUCOSE 106 (H) 01/03/2017 0702   BUN 10 01/03/2017 0702   CREATININE  0.80 01/03/2017 0702   CALCIUM 8.0 (L) 01/03/2017 0637   PROT 7.5 01/03/2017 0637   ALBUMIN 4.2 01/03/2017 0637   AST 30 01/03/2017 0637   ALT 38 01/03/2017 0637   ALKPHOS 47 01/03/2017 0637   BILITOT 0.4 01/03/2017 0637   GFRNONAA >60 01/03/2017 0637   GFRAA >60 01/03/2017 0637   No results found for: CHOL, HDL, LDLCALC, LDLDIRECT, TRIG, CHOLHDL No results found for: 03/03/2017 No results found for: VITAMINB12 No results found for: TSH     ASSESSMENT AND PLAN 69 y.o. year old male  has a past medical history of Acid reflux, ED (erectile dysfunction), Hypertension, and Tremor. here with     ICD-10-CM   1. Essential tremor  G25.0 Primidone level     Ernie continues to do fairly well on primidone. He will continue 500mg  daily. We have discussed alternative treatment options, howeer, he is not interested at this time. We will update primidone labs today. CBC and Chem reviewed from PCP in 06/2020. He was encouraged to continues healthy lifestyle habits. Follow up in 1 year.   Orders Placed This Encounter  Procedures  . Primidone level     Meds ordered this encounter  Medications  . primidone (MYSOLINE) 250 MG tablet    Sig: Take 1 tablet (250 mg total) by mouth 2 (two) times daily.    Dispense:  180 tablet    Refill:  4    Order Specific Question:   Supervising Provider    Answer:   07/2020      I spent 20 minutes with the patient. 50% of this time was spent counseling and educating patient on plan of care and medications.    Anson Fret, FNP-C 08/30/2020, 2:51 PM [facility] [address], [address] [place], [address] [place] [phone]

## 2020-08-30 NOTE — Patient Instructions (Signed)
Continue primidone 500mg  daily.   Follow up in 1 year   Essential Tremor A tremor is trembling or shaking that a person cannot control. Most tremors affect the hands or arms. Tremors can also affect the head, vocal cords, legs, and other parts of the body. Essential tremor is a tremor without a known cause. Usually, it occurs while a person is trying to perform an action. It tends to get worse gradually as a person ages. What are the causes? The cause of this condition is not known. What increases the risk? You are more likely to develop this condition if:  You have a family member with essential tremor.  You are age 92 or older.  You take certain medicines. What are the signs or symptoms? The main sign of a tremor is a rhythmic shaking of certain parts of your body that is uncontrolled and unintentional. You may:  Have difficulty eating with a spoon or fork.  Have difficulty writing.  Nod your head up and down or side to side.  Have a quivering voice. The shaking may:  Get worse over time.  Come and go.  Be more noticeable on one side of your body.  Get worse due to stress, fatigue, caffeine, and extreme heat or cold. How is this diagnosed? This condition may be diagnosed based on:  Your symptoms and medical history.  A physical exam. There is no single test to diagnose an essential tremor. However, your health care provider may order tests to rule out other causes of your condition. These may include:  Blood and urine tests.  Imaging studies of your brain, such as CT scan and MRI.  A test that measures involuntary muscle movement (electromyogram). How is this treated? Treatment for essential tremor depends on the severity of the condition.  Some tremors may go away without treatment.  Mild tremors may not need treatment if they do not affect your day-to-day life.  Severe tremors may need to be treated using one or more of the following  options: ? Medicines. ? Lifestyle changes. ? Occupational or physical therapy. Follow these instructions at home: Lifestyle   Do not use any products that contain nicotine or tobacco, such as cigarettes and e-cigarettes. If you need help quitting, ask your health care provider.  Limit your caffeine intake as told by your health care provider.  Try to get 8 hours of sleep each night.  Find ways to manage your stress that fits your lifestyle and personality. Consider trying meditation or yoga.  Try to anticipate stressful situations and allow extra time to manage them.  If you are struggling emotionally with the effects of your tremor, consider working with a mental health provider. General instructions  Take over-the-counter and prescription medicines only as told by your health care provider.  Avoid extreme heat and extreme cold.  Keep all follow-up visits as told by your health care provider. This is important. Visits may include physical therapy visits. Contact a health care provider if:  You experience any changes in the location or intensity of your tremors.  You start having a tremor after starting a new medicine.  You have tremor with other symptoms, such as: ? Numbness. ? Tingling. ? Pain. ? Weakness.  Your tremor gets worse.  Your tremor interferes with your daily life.  You feel down, blue, or sad for at least 2 weeks in a row.  Worrying about your tremor and what other people think about you interferes with your everyday life  functions, including relationships, work, or school. Summary  Essential tremor is a tremor without a known cause. Usually, it occurs when you are trying to perform an action.  The cause of this condition is not known.  The main sign of a tremor is a rhythmic shaking of certain parts of your body that is uncontrolled and unintentional.  Treatment for essential tremor depends on the severity of the condition. This information is not  intended to replace advice given to you by your health care provider. Make sure you discuss any questions you have with your health care provider. Document Revised: 11/22/2017 Document Reviewed: 11/22/2017 Elsevier Patient Education  2020 [employer].

## 2020-08-31 ENCOUNTER — Ambulatory Visit: Payer: Medicare Other | Admitting: Family Medicine

## 2020-08-31 LAB — PRIMIDONE, SERUM
Phenobarbital, Serum: 10 ug/mL — ABNORMAL LOW (ref 15–40)
Primidone Lvl: 11.4 ug/mL (ref 5.0–12.0)

## 2021-05-02 ENCOUNTER — Other Ambulatory Visit: Payer: Self-pay

## 2021-05-02 ENCOUNTER — Other Ambulatory Visit: Payer: Self-pay | Admitting: Family Medicine

## 2021-05-02 ENCOUNTER — Ambulatory Visit
Admission: RE | Admit: 2021-05-02 | Discharge: 2021-05-02 | Disposition: A | Discharge status: Home / Self Care | Payer: Medicare Other | Source: Ambulatory Visit | Attending: Family Medicine | Admitting: Family Medicine

## 2021-05-02 DIAGNOSIS — R059 Cough, unspecified: Secondary | ICD-10-CM

## 2021-08-30 ENCOUNTER — Other Ambulatory Visit: Payer: Self-pay

## 2021-08-30 ENCOUNTER — Encounter: Payer: Self-pay | Admitting: Family Medicine

## 2021-08-30 ENCOUNTER — Ambulatory Visit (INDEPENDENT_AMBULATORY_CARE_PROVIDER_SITE_OTHER): Payer: Medicare Other | Admitting: Family Medicine

## 2021-08-30 VITALS — BP 159/86 | HR 59 | Ht 75.0 in | Wt 218.0 lb

## 2021-08-30 DIAGNOSIS — G250 Essential tremor: Secondary | ICD-10-CM | POA: Diagnosis not present

## 2021-08-30 NOTE — Progress Notes (Signed)
PATIENT: Steven Rice DOB: 11/23/1951  REASON FOR VISIT: follow up HISTORY FROM: patient  Chief Complaint  Patient presents with   Follow-up    New rm, alone. Here for yearly tremor f/u. Pt reports no changes in sx.       HISTORY OF PRESENT ILLNESS: 08/30/21 ALL:  Steven Rice returns for follow up for essential tremor. He continues primidone 250mg  BID. He feels tremor is stable. PCP is checking labs and refilling medication for him. He continues to work in care. His mom was diagnosed with dementia. He reports medical team feels it was directly related to Covid vaccine as hallucinations occurred days after second vaccine. She is stable at this time.   08/30/2020 ALL: Steven Rice is a 70 y.o. male here today for follow up for essential tremor. He feels tremor is stable. He is unsure if primidone works as well as he would like but feels it is the best option for him. He has checked into DBS but is not interested at this time. His mother was recently diagnosed with dementia. He is concerned this was brought on by Covid vaccine.   HISTORY: (copied from Dr 62 note on 09/01/2019)  UPDATE (09/01/19, VRP): Since last visit, doing about the same. Symptoms are stable. Severity is moderate. No alleviating or aggravating factors. Tolerating primidone 500mg  in AM.     UPDATE (08/19/18, VRP): Since last visit, doing well. Symptoms are stable. Severity is mild. No alleviating or aggravating factors. Tolerating primidone. Not interested in DBS yet.    UPDATE (10/21/17, VRP): Since last visit, doing about the same. Tremor stable. Tolerating meds. No alleviating or aggravating factors. No benefit with topiramate. He is interested in DBS surgery in [place], after discussing with his sister's friend who had the surgery done there for ET with good benefit.    UPDATE 04/16/17: Since last visit, doing well. Tremor stable. Tolerating primidone. Has considered DBS, but wants to hold off for now.     UPDATE 10/08/16: Since last visit, had evaluation at [facility] with Dr. 10/10/16 and Dr[facility]. Patient wants to hold off on surgery at this time. Tremor stable.    PRIOR HPI (07/03/16): 70 year old right-handed male here for evaluation of essential tremor. Patient reports onset of gradual, progressive postural and action tremor of bilateral upper studies around age 31 years old. No family history of tremor. Patient was diagnosed with essential tremor started on primidone. Patient not taking. On 200 mg daily. Sometimes he takes 2, 3 or 4 total tablets per day is under stressful situations. Patient has noted some side effects including erectile dysfunction, decreased libido, frequent urination which she attributes to primidone. Patient was evaluated by Dr. 71 and 41 in the past (from 2002 until last visit 08/18/12).  Patient is frustrated with progressive tremor. Patient tried propranolol at some point in the past but this caused severe side effects. It is affecting his ability to eat, do fine motor tasks, and making him self-conscious in public situations. He is interested in surgical evaluation with deep brain stimulation. 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 Apparently his prior neurologist mention this could've been a factor in his development of tremor. No family history of tremor otherwise.     REVIEW OF SYSTEMS: Out of a complete 14 system review of symptoms, the patient complains only of the following symptoms, tremor and all other reviewed systems are negative.  ALLERGIES: Allergies  Allergen Reactions   Hydrocodone-Acetaminophen  Other (See Comments)    Reaction:  Insomnia     HOME MEDICATIONS: Outpatient Medications Prior to Visit  Medication Sig Dispense Refill   amLODipine (NORVASC) 10 MG tablet Take 10 mg by mouth daily.      aspirin EC 81 MG tablet Take 81 mg by mouth daily.      atorvastatin (LIPITOR) 40 MG tablet TAKE 1 TABLET BY MOUTH  EVERY DAY FOR CHOLESTEROL     famotidine (PEPCID) 20 MG tablet Take 1 tablet (20 mg total) by mouth 2 (two) times daily. 10 tablet 0   lisinopril (PRINIVIL,ZESTRIL) 20 MG tablet Take 20 mg by mouth daily.     primidone (MYSOLINE) 250 MG tablet Take 1 tablet (250 mg total) by mouth 2 (two) times daily. 180 tablet 4   vitamin C (ASCORBIC ACID) 500 MG tablet Take 500 mg by mouth daily.      Facility-Administered Medications Prior to Visit  Medication Dose Route Frequency Provider Last Rate Last Admin   triamcinolone acetonide (KENALOG) 10 MG/ML injection 10 mg  10 mg Other Once Asencion Islam, DPM        PAST MEDICAL HISTORY: Past Medical History:  Diagnosis Date   Acid reflux    ED (erectile dysfunction)    "from primidone"   Hypertension    Tremor     PAST SURGICAL HISTORY: Past Surgical History:  Procedure Laterality Date   ANKLE FRACTURE SURGERY Left 2010   ROTATOR CUFF REPAIR Right    x 3   SECONDARY INTRAOCULAR LENSE IMPLANTATION     Rt eye    FAMILY HISTORY: Family History  Problem Relation Age of Onset   Emphysema Father    Cancer Brother     SOCIAL HISTORY: Social History   Socioeconomic History   Marital status: Married    Spouse name: Not on file   Number of children: Not on file   Years of education: Not on file   Highest education level: Not on file  Occupational History   Not on file  Tobacco Use   Smoking status: Former    Types: Cigarettes    Quit date: 07/04/2003    Years since quitting: 18.1   Smokeless tobacco: Never  Substance and Sexual Activity   Alcohol use: No   Drug use: No   Sexual activity: Not on file  Other Topics Concern   Not on file  Social History Narrative   Not on file   Social Determinants of Health   Financial Resource Strain: Not on file  Food Insecurity: Not on file  Transportation Needs: Not on file  Physical Activity: Not on file  Stress: Not on file  Social Connections: Not on file  Intimate Partner  Violence: Not on file      PHYSICAL EXAM  Vitals:   08/30/21 1405  BP: (!) 159/86  Pulse: (!) 59  Weight: 218 lb (98.9 kg)  Height: 6\' 3"  (1.905 m)    Body mass index is 27.25 kg/m.  Generalized: Well developed, in no acute distress  Cardiology: normal rate and rhythm, no murmur noted Respiratory: clear to auscultation bilaterally  Neurological examination  Mentation: Alert oriented to time, place, history taking. Follows all commands speech and language fluent Cranial nerve II-XII: Pupils were equal round reactive to light. Extraocular movements were full, visual field were full on confrontational test. Facial sensation and strength were normal.  Head turning and shoulder shrug  were normal and symmetric. Motor: The motor testing reveals 5 over 5  strength of all 4 extremities. Good symmetric motor tone is noted throughout. No tremor noted on today's exam Gait and station: Gait is normal.   DIAGNOSTIC DATA (LABS, IMAGING, TESTING) - I reviewed patient records, labs, notes, testing and imaging myself where available.  No flowsheet data found.   Lab Results  Component Value Date   WBC 9.4 01/03/2017   HGB 14.3 01/03/2017   HCT 42.0 01/03/2017   MCV 89.2 01/03/2017   PLT 273 01/03/2017      Component Value Date/Time   NA 139 01/03/2017 0702   K 4.8 01/03/2017 0702   CL 103 01/03/2017 0702   CO2 23 01/03/2017 0637   GLUCOSE 106 (H) 01/03/2017 0702   BUN 10 01/03/2017 0702   CREATININE 0.80 01/03/2017 0702   CALCIUM 8.0 (L) 01/03/2017 0637   PROT 7.5 01/03/2017 0637   ALBUMIN 4.2 01/03/2017 0637   AST 30 01/03/2017 0637   ALT 38 01/03/2017 0637   ALKPHOS 47 01/03/2017 0637   BILITOT 0.4 01/03/2017 0637   GFRNONAA >60 01/03/2017 0637   GFRAA >60 01/03/2017 0637   No results found for: CHOL, HDL, LDLCALC, LDLDIRECT, TRIG, CHOLHDL No results found for: CNOB0J No results found for: VITAMINB12 No results found for: TSH     ASSESSMENT AND PLAN 70 y.o. year old  male  has a past medical history of Acid reflux, ED (erectile dysfunction), Hypertension, and Tremor. here with     ICD-10-CM   1. Essential tremor  G25.0         Ernie continues to do fairly well on primidone. He will continue 250mg  twice daily. We have discussed alternative treatment options, however, he is not interested at this time. He reports refills and labs are not being managed by PCP. He was encouraged to continues healthy lifestyle habits. Follow up as needed.   No orders of the defined types were placed in this encounter.    No orders of the defined types were placed in this encounter.     , FNP-C 08/30/2021, 2:19 PM [facility] [address] [place], [place] [phone]

## 2021-08-30 NOTE — Patient Instructions (Signed)
Below is our plan:  We will continue primidone 250mg  twice daily. Please continue close follow up with PCP for refills and labs.   Please make sure you are staying well hydrated. I recommend 50-60 ounces daily. Well balanced diet and regular exercise encouraged. Consistent sleep schedule with 6-8 hours recommended.   Please continue follow up with care team as directed.   Follow up with me in as needed  You may receive a survey regarding today's visit. I encourage you to leave honest feed back as I do use this information to improve patient care. Thank you for seeing me today!

## 2021-11-09 ENCOUNTER — Encounter (INDEPENDENT_AMBULATORY_CARE_PROVIDER_SITE_OTHER): Payer: Medicare Other | Admitting: Ophthalmology

## 2021-11-09 ENCOUNTER — Other Ambulatory Visit: Payer: Self-pay

## 2021-11-09 DIAGNOSIS — H2512 Age-related nuclear cataract, left eye: Secondary | ICD-10-CM | POA: Diagnosis not present

## 2021-11-09 DIAGNOSIS — H35033 Hypertensive retinopathy, bilateral: Secondary | ICD-10-CM

## 2021-11-09 DIAGNOSIS — H43813 Vitreous degeneration, bilateral: Secondary | ICD-10-CM | POA: Diagnosis not present

## 2021-11-09 DIAGNOSIS — I10 Essential (primary) hypertension: Secondary | ICD-10-CM | POA: Diagnosis not present

## 2023-02-27 IMAGING — CR DG CHEST 2V
2 series · 2 of 2 positions shown · non-contrast
Comparison: 01/03/2017

CLINICAL DATA: Cough

EXAM:
CHEST - 2 VIEW

[w chest pa]
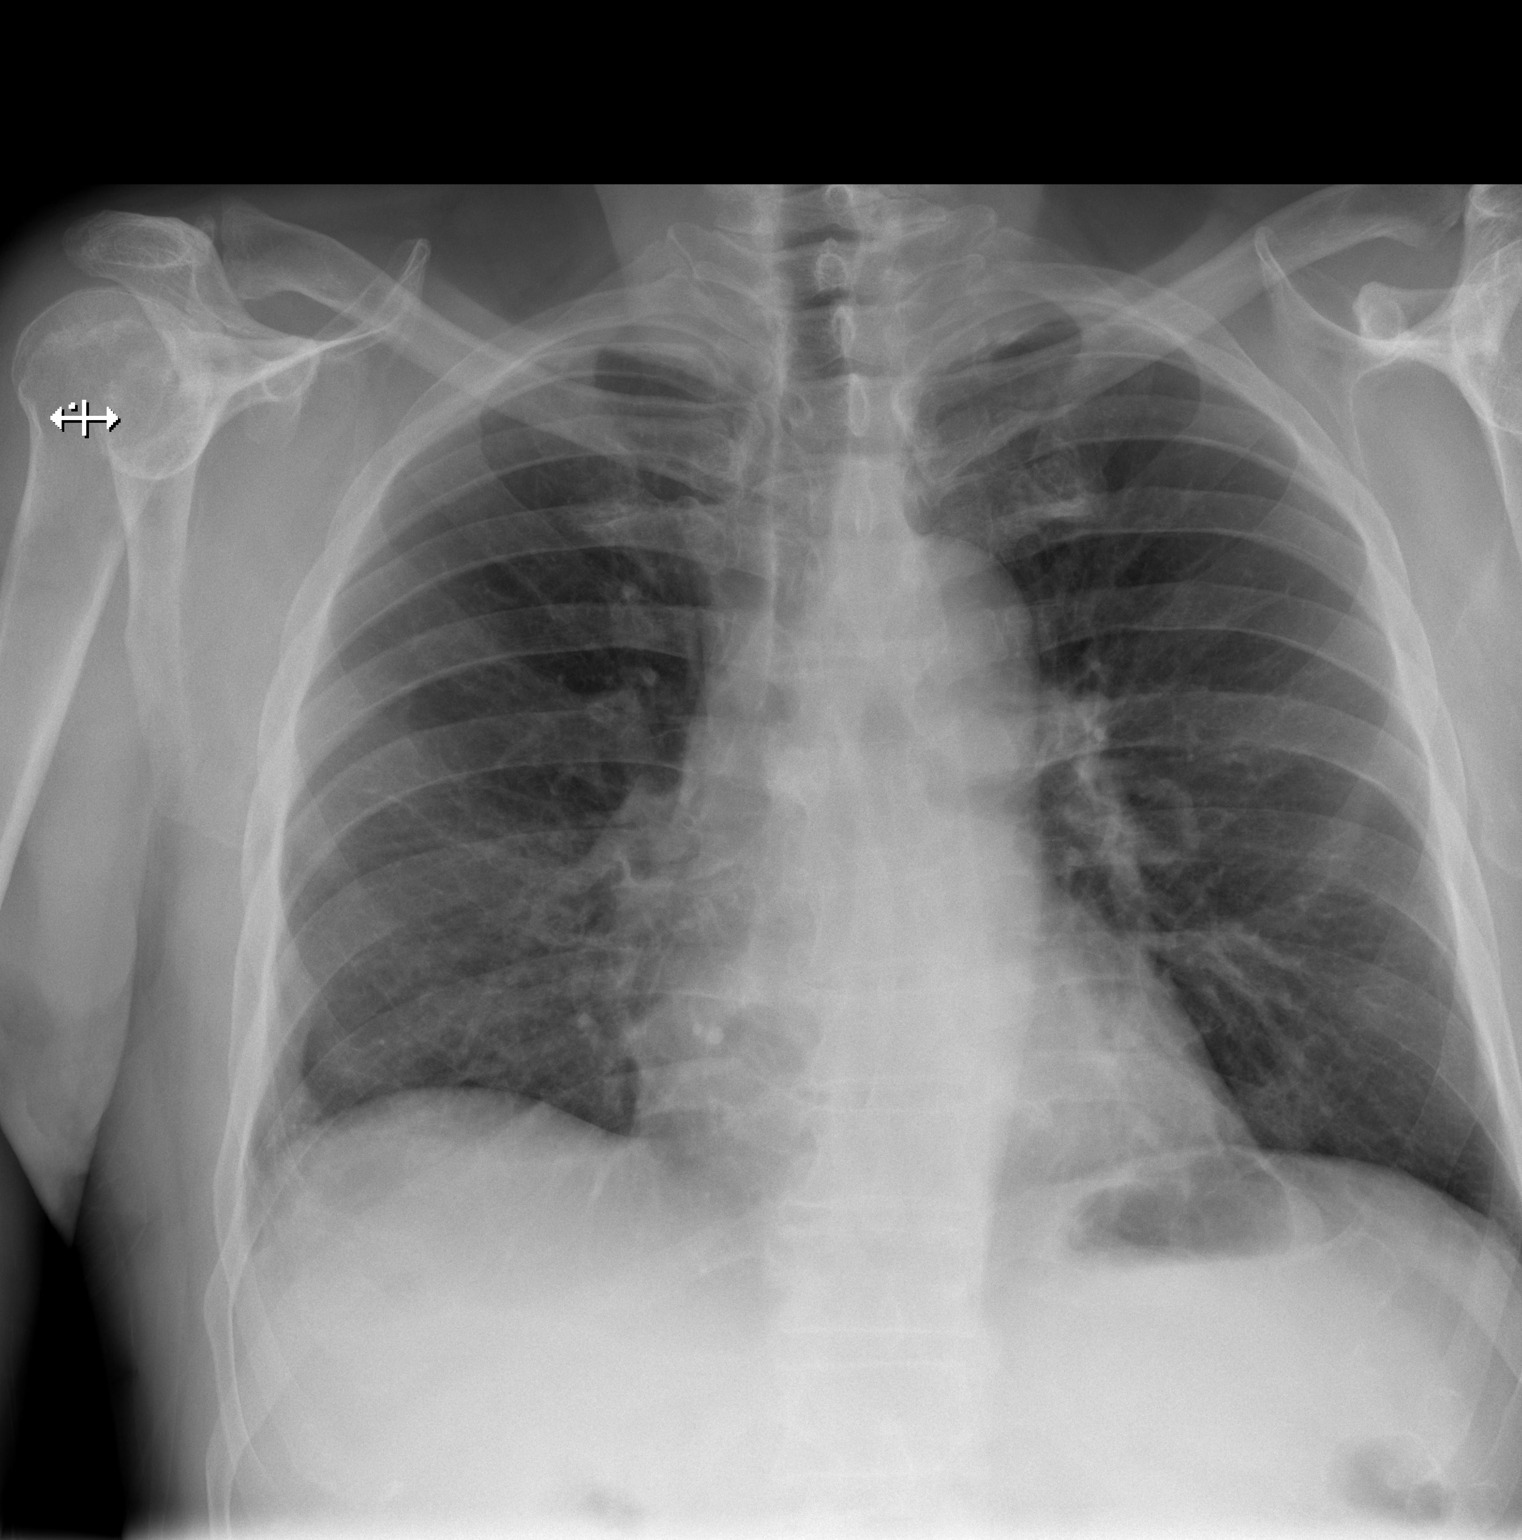

[w chest lat]
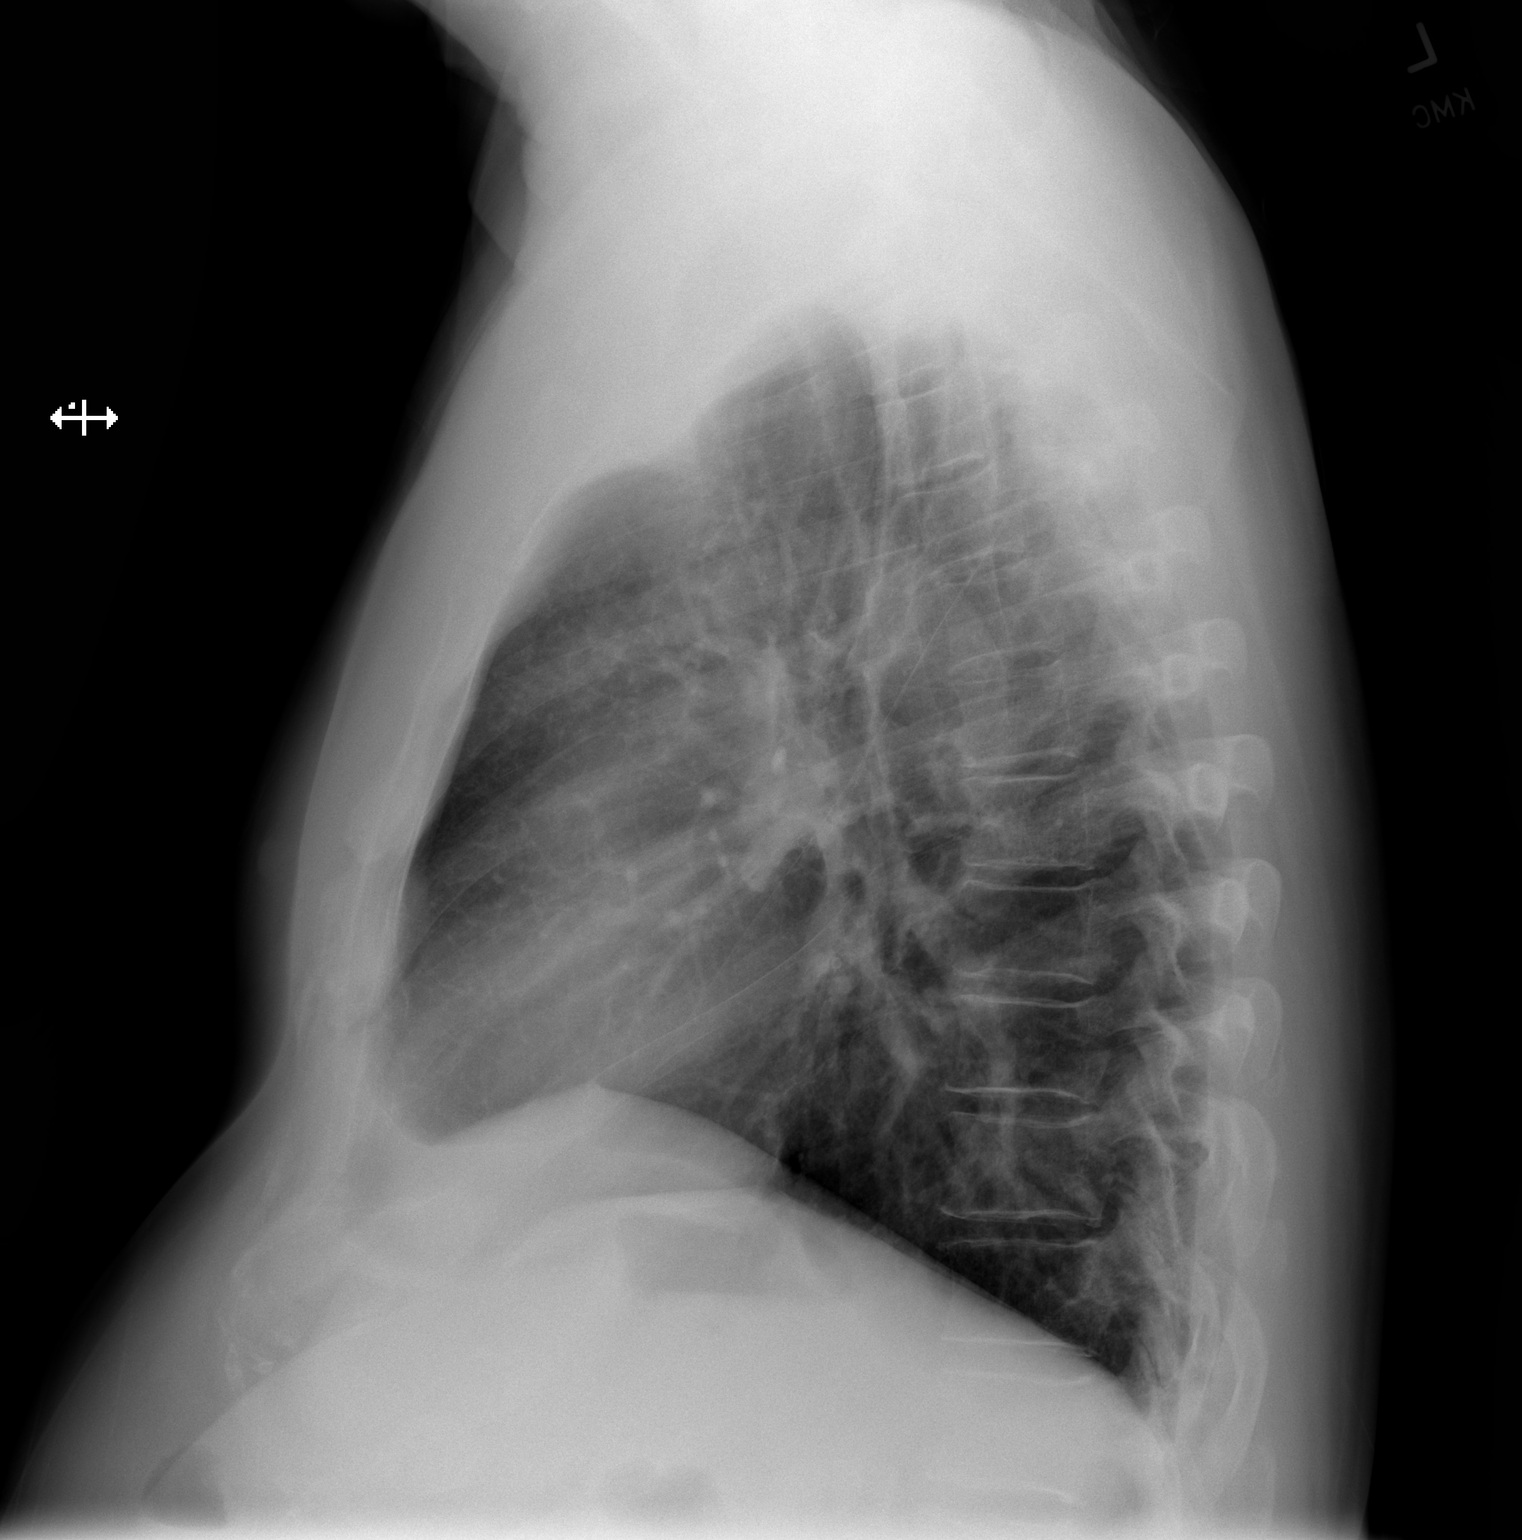

[2 of 2 positions shown; findings below may reference images not displayed]

FINDINGS: Normal heart size, mediastinal contours, and pulmonary vascularity.

Lungs clear.

No pulmonary infiltrate, pleural effusion, or pneumothorax.

Osseous structures unremarkable.
IMPRESSION: No acute abnormalities.

## 2023-09-16 ENCOUNTER — Other Ambulatory Visit: Payer: Self-pay | Admitting: Urology

## 2023-09-16 DIAGNOSIS — R972 Elevated prostate specific antigen [PSA]: Secondary | ICD-10-CM

## 2023-11-05 ENCOUNTER — Ambulatory Visit
Admission: RE | Admit: 2023-11-05 | Discharge: 2023-11-05 | Disposition: A | Discharge status: Home / Self Care | Payer: Medicare Other | Source: Ambulatory Visit | Attending: Urology | Admitting: Urology

## 2023-11-05 DIAGNOSIS — R972 Elevated prostate specific antigen [PSA]: Secondary | ICD-10-CM

## 2023-11-05 MED ORDER — GADOPICLENOL 0.5 MMOL/ML IV SOLN
10.0000 mL | Freq: Once | INTRAVENOUS | Status: AC | PRN
  Administered 2023-11-05: 10 mL via INTRAVENOUS
